# Patient Record
Sex: Female | Born: 1950 | Race: White | Hispanic: No | Marital: Married | Smoking: Never smoker
Health system: Southern US, Community
[De-identification: ages and names within clinical notes are randomized; demographics above are authoritative.]

## PROBLEM LIST (undated history)

## (undated) DIAGNOSIS — N200 Calculus of kidney: Secondary | ICD-10-CM

## (undated) DIAGNOSIS — I10 Essential (primary) hypertension: Secondary | ICD-10-CM

## (undated) DIAGNOSIS — Z87442 Personal history of urinary calculi: Secondary | ICD-10-CM

## (undated) DIAGNOSIS — N289 Disorder of kidney and ureter, unspecified: Secondary | ICD-10-CM

## (undated) DIAGNOSIS — M1990 Unspecified osteoarthritis, unspecified site: Secondary | ICD-10-CM

## (undated) HISTORY — PX: BELPHAROPTOSIS REPAIR: SHX369

## (undated) HISTORY — PX: EYE SURGERY: SHX253

## (undated) HISTORY — PX: APPENDECTOMY: SHX54

## (undated) HISTORY — PX: KIDNEY STONE SURGERY: SHX686

---

## 1979-09-17 HISTORY — PX: CHOLECYSTECTOMY: SHX55

## 1998-09-16 HISTORY — PX: CARPAL TUNNEL RELEASE: SHX101

## 2002-09-16 HISTORY — PX: NISSEN FUNDOPLICATION: SHX2091

## 2003-08-02 ENCOUNTER — Ambulatory Visit (HOSPITAL_COMMUNITY): Admission: RE | Admit: 2003-08-02 | Discharge: 2003-08-02 | Payer: Self-pay | Admitting: Plastic Surgery

## 2003-08-02 ENCOUNTER — Ambulatory Visit (HOSPITAL_BASED_OUTPATIENT_CLINIC_OR_DEPARTMENT_OTHER): Admission: RE | Admit: 2003-08-02 | Discharge: 2003-08-02 | Payer: Self-pay | Admitting: Plastic Surgery

## 2011-04-02 ENCOUNTER — Ambulatory Visit
Admission: RE | Admit: 2011-04-02 | Discharge: 2011-04-02 | Disposition: A | Discharge status: Home / Self Care | Payer: Managed Care, Other (non HMO) | Source: Ambulatory Visit | Attending: *Deleted | Admitting: *Deleted

## 2011-04-02 ENCOUNTER — Other Ambulatory Visit: Payer: Self-pay | Admitting: *Deleted

## 2011-04-02 DIAGNOSIS — M549 Dorsalgia, unspecified: Secondary | ICD-10-CM

## 2012-09-16 HISTORY — PX: COLONOSCOPY: SHX174

## 2015-06-28 ENCOUNTER — Encounter (HOSPITAL_BASED_OUTPATIENT_CLINIC_OR_DEPARTMENT_OTHER): Payer: Self-pay | Admitting: *Deleted

## 2015-06-28 ENCOUNTER — Emergency Department (HOSPITAL_BASED_OUTPATIENT_CLINIC_OR_DEPARTMENT_OTHER): Payer: 59

## 2015-06-28 ENCOUNTER — Emergency Department (HOSPITAL_BASED_OUTPATIENT_CLINIC_OR_DEPARTMENT_OTHER)
Admission: EM | Admit: 2015-06-28 | Discharge: 2015-06-28 | Disposition: A | Discharge status: Home / Self Care | Payer: 59 | Attending: Emergency Medicine | Admitting: Emergency Medicine

## 2015-06-28 DIAGNOSIS — S300XXA Contusion of lower back and pelvis, initial encounter: Secondary | ICD-10-CM | POA: Diagnosis not present

## 2015-06-28 DIAGNOSIS — S39012A Strain of muscle, fascia and tendon of lower back, initial encounter: Secondary | ICD-10-CM | POA: Insufficient documentation

## 2015-06-28 DIAGNOSIS — Y9289 Other specified places as the place of occurrence of the external cause: Secondary | ICD-10-CM | POA: Diagnosis not present

## 2015-06-28 DIAGNOSIS — Z791 Long term (current) use of non-steroidal anti-inflammatories (NSAID): Secondary | ICD-10-CM | POA: Insufficient documentation

## 2015-06-28 DIAGNOSIS — W109XXA Fall (on) (from) unspecified stairs and steps, initial encounter: Secondary | ICD-10-CM | POA: Insufficient documentation

## 2015-06-28 DIAGNOSIS — S7012XA Contusion of left thigh, initial encounter: Secondary | ICD-10-CM | POA: Insufficient documentation

## 2015-06-28 DIAGNOSIS — Z79899 Other long term (current) drug therapy: Secondary | ICD-10-CM | POA: Insufficient documentation

## 2015-06-28 DIAGNOSIS — Z87442 Personal history of urinary calculi: Secondary | ICD-10-CM | POA: Insufficient documentation

## 2015-06-28 DIAGNOSIS — Y998 Other external cause status: Secondary | ICD-10-CM | POA: Diagnosis not present

## 2015-06-28 DIAGNOSIS — Y9389 Activity, other specified: Secondary | ICD-10-CM | POA: Insufficient documentation

## 2015-06-28 DIAGNOSIS — S79912A Unspecified injury of left hip, initial encounter: Secondary | ICD-10-CM | POA: Diagnosis not present

## 2015-06-28 DIAGNOSIS — S3992XA Unspecified injury of lower back, initial encounter: Secondary | ICD-10-CM | POA: Diagnosis present

## 2015-06-28 DIAGNOSIS — M25552 Pain in left hip: Secondary | ICD-10-CM

## 2015-06-28 DIAGNOSIS — R109 Unspecified abdominal pain: Secondary | ICD-10-CM

## 2015-06-28 HISTORY — DX: Calculus of kidney: N20.0

## 2015-06-28 HISTORY — DX: Disorder of kidney and ureter, unspecified: N28.9

## 2015-06-28 LAB — CBC WITH DIFFERENTIAL/PLATELET
BASOS ABS: 0 10*3/uL (ref 0.0–0.1)
Basophils Relative: 0 %
EOS PCT: 0 %
Eosinophils Absolute: 0 10*3/uL (ref 0.0–0.7)
HCT: 38.2 % (ref 36.0–46.0)
HEMOGLOBIN: 12.6 g/dL (ref 12.0–15.0)
LYMPHS ABS: 1.4 10*3/uL (ref 0.7–4.0)
LYMPHS PCT: 20 %
MCH: 30.8 pg (ref 26.0–34.0)
MCHC: 33 g/dL (ref 30.0–36.0)
MCV: 93.4 fL (ref 78.0–100.0)
MONO ABS: 0.5 10*3/uL (ref 0.1–1.0)
Monocytes Relative: 7 %
NEUTROS ABS: 5.2 10*3/uL (ref 1.7–7.7)
Neutrophils Relative %: 73 %
PLATELETS: 250 10*3/uL (ref 150–400)
RBC: 4.09 MIL/uL (ref 3.87–5.11)
RDW: 12.5 % (ref 11.5–15.5)
WBC: 7.2 10*3/uL (ref 4.0–10.5)

## 2015-06-28 LAB — URINALYSIS, ROUTINE W REFLEX MICROSCOPIC
Bilirubin Urine: NEGATIVE
GLUCOSE, UA: NEGATIVE mg/dL
HGB URINE DIPSTICK: NEGATIVE
Ketones, ur: 15 mg/dL — AB
Leukocytes, UA: NEGATIVE
Nitrite: NEGATIVE
Protein, ur: NEGATIVE mg/dL
SPECIFIC GRAVITY, URINE: 1.02 (ref 1.005–1.030)
Urobilinogen, UA: 0.2 mg/dL (ref 0.0–1.0)
pH: 7.5 (ref 5.0–8.0)

## 2015-06-28 LAB — BASIC METABOLIC PANEL
ANION GAP: 10 (ref 5–15)
BUN: 16 mg/dL (ref 6–20)
CHLORIDE: 103 mmol/L (ref 101–111)
CO2: 26 mmol/L (ref 22–32)
Calcium: 9.1 mg/dL (ref 8.9–10.3)
Creatinine, Ser: 0.52 mg/dL (ref 0.44–1.00)
Glucose, Bld: 141 mg/dL — ABNORMAL HIGH (ref 65–99)
POTASSIUM: 4.1 mmol/L (ref 3.5–5.1)
SODIUM: 139 mmol/L (ref 135–145)

## 2015-06-28 MED ORDER — SODIUM CHLORIDE 0.9 % IV BOLUS (SEPSIS)
500.0000 mL | Freq: Once | INTRAVENOUS | Status: AC
  Administered 2015-06-28: 500 mL via INTRAVENOUS

## 2015-06-28 MED ORDER — ONDANSETRON HCL 4 MG/2ML IJ SOLN
4.0000 mg | Freq: Once | INTRAMUSCULAR | Status: AC
  Administered 2015-06-28: 4 mg via INTRAVENOUS
  Filled 2015-06-28: qty 2

## 2015-06-28 MED ORDER — FENTANYL CITRATE (PF) 100 MCG/2ML IJ SOLN
50.0000 ug | Freq: Once | INTRAMUSCULAR | Status: AC
  Administered 2015-06-28: 50 ug via INTRAVENOUS
  Filled 2015-06-28: qty 2

## 2015-06-28 MED ORDER — SODIUM CHLORIDE 0.9 % IV SOLN
INTRAVENOUS | Status: DC
  Administered 2015-06-28: 10:00:00 via INTRAVENOUS

## 2015-06-28 MED ORDER — HYDROMORPHONE HCL 1 MG/ML IJ SOLN
1.0000 mg | Freq: Once | INTRAMUSCULAR | Status: AC
Start: 2015-06-28 — End: 2015-06-28
  Administered 2015-06-28: 1 mg via INTRAVENOUS
  Filled 2015-06-28: qty 1

## 2015-06-28 MED ORDER — CYCLOBENZAPRINE HCL 10 MG PO TABS: 10.0000 mg | ORAL_TABLET | Freq: Two times a day (BID) | ORAL | Status: DC | PRN

## 2015-06-28 MED ORDER — OXYCODONE-ACETAMINOPHEN 5-325 MG PO TABS: 1.0000 | ORAL_TABLET | Freq: Four times a day (QID) | ORAL | Status: DC | PRN

## 2015-06-28 NOTE — ED Notes (Signed)
Soda given to pt per her request.

## 2015-06-28 NOTE — ED Notes (Signed)
PT standing on arrival to room. After d/c papers discussed pt states she may black out. Pt assisted to lay flat on bed and given cool cloth for head. Dr Deretha EmoryZackowski made aware. States feels like it is the pain meds. On arrival back to room pt assisted to sitting position and states feels okay now. Husband assisted with clothes and pt assisted to wheelchair without difficulty. Pt states she is feeling better. Pt assisted to pharmacy and car by EMT Dawn.

## 2015-06-28 NOTE — Discharge Instructions (Signed)
Recommend rest off her feet is much as possible. Take the Flexeril on a regular basis take the the Percocet as needed. Make an appointment to follow-up with your regular doctor for next week. Return for any new or worse symptoms. Workup without any evidence of kidney stone. Also no evidence of any acute fracture to the low part of the back or pelvis or left hip.

## 2015-06-28 NOTE — ED Notes (Addendum)
C/o falling down 10 steps made of hardwood. States her leg stradled post at bottom of stairs.Denies LOC. Onset Monday. Bruising on upper left inner thigh and right butt cheek.C/o left back pain and left groin pain. Left leg feels weak. Pt states she has known kidney stones and pain is similar to her kidney stones.

## 2015-06-28 NOTE — ED Provider Notes (Signed)
CSN: [id number]     Arrival date & time 06/28/15  [id number] History   First MD Initiated Contact with Patient 06/28/15 [phone]     No chief complaint on file.    (Consider location/radiation/quality/duration/timing/severity/associated sxs/prior Treatment) The history is provided by the patient and the spouse.   64 year old female status post fall down 10 steps on Monday. Patient with complaint of pain to right buttocks with bruising and right hip pain following the fall. The fat has resolved. Patient also with some bruising to her left inner leg around thigh area. Starting yesterday patient developed left-sided back pain that radiated into the left groin and left hip. Leg feels weak but mostly just hurts to move the left leg. Toes move fine sensations intact no incontinence. Patient had no loss of consciousness with the fall down the stairs. No complaint of neck pain and head pain chest pain shortness of breath upper back pain or abdominal pain. Does have the flank pain however on the left side to just started yesterday. Denies any dysuria associated with some nausea but no vomiting. Patient has hydrocodone at home and took that without any relief. Patient has a past history of kidney stones states that this may be related. The pain is 10 out of 10. Sharp and achy in nature made worse by moving left leg.  Past Medical History  Diagnosis Date  . Renal disorder   . Kidney stones    History reviewed. No pertinent past surgical history. No family history on file. Social History  Substance Use Topics  . Smoking status: Never Smoker   . Smokeless tobacco: None  . Alcohol Use: None   OB History    No data available     Review of Systems  Constitutional: Negative for fever and fatigue.  HENT: Negative for congestion.   Eyes: Negative for visual disturbance.  Respiratory: Negative for shortness of breath.   Cardiovascular: Negative for chest pain.  Gastrointestinal: Positive for nausea. Negative  for vomiting and abdominal pain.  Genitourinary: Negative for dysuria.  Musculoskeletal: Positive for back pain. Negative for neck pain.  Skin: Negative for rash.  Neurological: Positive for weakness. Negative for numbness and headaches.  Hematological: Does not bruise/bleed easily.  Psychiatric/Behavioral: Negative for confusion.      Allergies  Flagyl  Home Medications   Prior to Admission medications   Medication Sig Start Date End Date Taking? Authorizing Provider  cetirizine (ZYRTEC) 10 MG tablet Take 10 mg by mouth daily.   Yes Historical Provider, MD  LORazepam (ATIVAN) 0.5 MG tablet Take 0.5 mg by mouth at bedtime.   Yes Historical Provider, MD  meloxicam (MOBIC) 7.5 MG tablet Take 7.5 mg by mouth daily.   Yes Historical Provider, MD  sertraline (ZOLOFT) 25 MG tablet Take 25 mg by mouth daily.   Yes Historical Provider, MD  cyclobenzaprine (FLEXERIL) 10 MG tablet Take 1 tablet (10 mg total) by mouth 2 (two) times daily as needed for muscle spasms. 06/28/15   Vanetta MuldersScott Brooklen Runquist, MD  oxyCODONE-acetaminophen (PERCOCET/ROXICET) 5-325 MG tablet Take 1-2 tablets by mouth every 6 (six) hours as needed for severe pain. 06/28/15   Vanetta MuldersScott Domonique Cothran, MD   BP 132/57 mmHg  Pulse 95  Temp(Src) 97.3 F (36.3 C) (Oral)  Resp 16  Ht 4\' 11"  (1.499 m)  Wt 150 lb (68.04 kg)  BMI 30.28 kg/m2  SpO2 98% Physical Exam  Constitutional: She is oriented to person, place, and time. She appears well-developed and well-nourished. She appears distressed.  HENT:  Head: Normocephalic and atraumatic.  Mouth/Throat: Oropharynx is clear and moist.  Eyes: Conjunctivae and EOM are normal. Pupils are equal, round, and reactive to light.  Neck: Normal range of motion. Neck supple.  Cardiovascular: Normal rate, regular rhythm and normal heart sounds.   No murmur heard. Pulmonary/Chest: Effort normal and breath sounds normal. No respiratory distress.  Abdominal: Soft. Bowel sounds are normal. There is no  tenderness.  Musculoskeletal: Normal range of motion. She exhibits tenderness.  Patient with discomfort to the lumbar back predominantly on the left side and some left hip pain particularly with range of motion. Neurovascularly intact distally. Right buttocks area with area of contusion. Bruising noted to left inner leg as well.  Neurological: She is alert and oriented to person, place, and time. No cranial nerve deficit. She exhibits normal muscle tone. Coordination normal.  Skin: Skin is warm.  Nursing note and vitals reviewed.   ED Course  Procedures (including critical care time) Labs Review Labs Reviewed  URINALYSIS, ROUTINE W REFLEX MICROSCOPIC (NOT AT [facility]) - Abnormal; Notable for the following:    APPearance CLOUDY (*)    Ketones, ur 15 (*)    All other components within normal limits  BASIC METABOLIC PANEL - Abnormal; Notable for the following:    Glucose, Bld 141 (*)    All other components within normal limits  CBC WITH DIFFERENTIAL/PLATELET   Results for orders placed or performed during the hospital encounter of 06/28/15  Urinalysis, Routine w reflex microscopic (not at [facility])  Result Value Ref Range   Color, Urine YELLOW YELLOW   APPearance CLOUDY (A) CLEAR   Specific Gravity, Urine 1.020 1.005 - 1.030   pH 7.5 5.0 - 8.0   Glucose, UA NEGATIVE NEGATIVE mg/dL   Hgb urine dipstick NEGATIVE NEGATIVE   Bilirubin Urine NEGATIVE NEGATIVE   Ketones, ur 15 (A) NEGATIVE mg/dL   Protein, ur NEGATIVE NEGATIVE mg/dL   Urobilinogen, UA 0.2 0.0 - 1.0 mg/dL   Nitrite NEGATIVE NEGATIVE   Leukocytes, UA NEGATIVE NEGATIVE  CBC with Differential/Platelet  Result Value Ref Range   WBC 7.2 4.0 - 10.5 K/uL   RBC 4.09 3.87 - 5.11 MIL/uL   Hemoglobin 12.6 12.0 - 15.0 g/dL   HCT 16.1 09.6 - 04.5 %   MCV 93.4 78.0 - 100.0 fL   MCH 30.8 26.0 - 34.0 pg   MCHC 33.0 30.0 - 36.0 g/dL   RDW 40.9 81.1 - 91.4 %   Platelets 250 150 - 400 K/uL   Neutrophils Relative % 73 %   Neutro Abs 5.2  1.7 - 7.7 K/uL   Lymphocytes Relative 20 %   Lymphs Abs 1.4 0.7 - 4.0 K/uL   Monocytes Relative 7 %   Monocytes Absolute 0.5 0.1 - 1.0 K/uL   Eosinophils Relative 0 %   Eosinophils Absolute 0.0 0.0 - 0.7 K/uL   Basophils Relative 0 %   Basophils Absolute 0.0 0.0 - 0.1 K/uL  Basic metabolic panel  Result Value Ref Range   Sodium 139 135 - 145 mmol/L   Potassium 4.1 3.5 - 5.1 mmol/L   Chloride 103 101 - 111 mmol/L   CO2 26 22 - 32 mmol/L   Glucose, Bld 141 (H) 65 - 99 mg/dL   BUN 16 6 - 20 mg/dL   Creatinine, Ser 7.82 0.44 - 1.00 mg/dL   Calcium 9.1 8.9 - 95.6 mg/dL   GFR calc non Af Amer >60 >60 mL/min   GFR calc Af Amer >60 >  60 mL/min   Anion gap 10 5 - 15     Imaging Review Ct Renal Stone Study  06/28/2015  CLINICAL DATA:  Left flank pain. Left groin pain. History of renal calculi. Bruising along the right buttock after a fall down some stairs 2 days ago. EXAM: CT ABDOMEN AND PELVIS WITHOUT CONTRAST TECHNIQUE: Multidetector CT imaging of the abdomen and pelvis was performed following the standard protocol without IV contrast. COMPARISON:  Report from 01/18/2013 FINDINGS: Lower chest:  Unremarkable Hepatobiliary: Geographic hepatic steatosis.  Cholecystectomy. Pancreas: Unremarkable Spleen: Unremarkable Adrenals/Urinary Tract: 2 mm right kidney lower pole nonobstructive calculus. 2 mm left kidney lower pole nonobstructive calculus. 1 mm left mid kidney nonobstructive calculus. Stomach/Bowel: Unremarkable Vascular/Lymphatic: Unremarkable Reproductive: Unremarkable Other: Chronic faint stranding at the root of the mesentery. Musculoskeletal: Bruising along the right buttock overlying the gluteus maximus without significant signs of underlying muscular injury. Assuming that the transitional lumbosacral vertebra is L5, there is considerable degenerative disc disease at the L4-5 level likely causing foraminal impingement and central narrowing of the thecal sac. IMPRESSION: 1. Increased  impingement at L4-5 due to degenerative disc disease. 2. Bruising along the right buttock in the subcutaneous tissues, without underlying muscular abnormality. 3. The bilateral nonobstructive nephrolithiasis. 4. Geographic hepatic steatosis. 5. 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   On: 06/28/2015 10:34   Dg Hip Unilat With Pelvis 2-3 Views Left  06/28/2015  CLINICAL DATA:  Fall down stairs on Monday.  Bilateral hip pain. EXAM: DG HIP (WITH OR WITHOUT PELVIS) 2-3V LEFT COMPARISON:  None. FINDINGS: No cortical discontinuity along the left hip to suggest hip fracture. Minimal degenerative subcortical cyst formation along the left acetabulum has a chronic appearance. Regional pelvis unremarkable. Transitional lumbosacral vertebra noted. IMPRESSION: 1. No acute bony findings. If pain persists despite conservative therapy, MRI may be warranted for further characterization. Electronically Signed   By: Gaylyn Rong M.D.   On: 06/28/2015 11:42   I have personally reviewed and evaluated these images and lab results as part of my medical decision-making.   EKG Interpretation None      MDM   Final diagnoses:  Flank pain  Lumbar strain, initial encounter  Hip pain, acute, left    Patient with a fall down 10 steps on Monday. No loss of consciousness. Patient with a bruise to her right buttocks area and some right hip pain following fall. But that resolved. Then yesterday patient started with left-sided pain left hip pain left low back pain. Does not radiate into the posterior part of her legs is no numbness or weakness to her feet. But it is difficult to walk. Patient felt that this may have been related to kidney stone she's had this before. However workup showed no evidence of any ureteral stone. Also the CT scan showed some degenerative changes in the low part of her back which  could be contributing to the pain. Pelvis area and hips looked all right but since patient had such increased pain in the left hip x-rays of that area were done to rule out any fracture. No evidence of fracture there. In addition patient did not have left hip pain following a fall that started afterwards. No significant right hip pain.  Patient required significant pain control. 100 mg of fentanyl and 1 mg of hydromorphone to get some relief. Believe that this is probably a lumbar strain with an exacerbation of  some chronic back pain. Patient's already on mobile. Will continue with Flexeril and Percocet. Patient does have hydrocodone at home but she stated that it did not help her at all. Patient without any significant neuro focal deficits. Patient has primary care doctor to follow-up with.    Vanetta Mulders, MD 06/28/15 1344

## 2015-06-28 NOTE — ED Notes (Signed)
Dr Deretha EmoryZackowski aware of pt pain. Order given.

## 2015-06-28 NOTE — ED Notes (Signed)
Husband offered something to drink.

## 2015-06-28 NOTE — ED Notes (Signed)
Husband offered something to drink. 

## 2015-06-28 NOTE — ED Notes (Signed)
Pt placed on cont pulse ox. O2 sats 99%.

## 2017-11-14 HISTORY — PX: BACK SURGERY: SHX140

## 2018-04-03 ENCOUNTER — Encounter: Payer: Self-pay | Admitting: Physical Therapy

## 2018-04-03 ENCOUNTER — Ambulatory Visit: Payer: Medicare Other | Attending: Neurosurgery | Admitting: Physical Therapy

## 2018-04-03 ENCOUNTER — Other Ambulatory Visit: Payer: Self-pay

## 2018-04-03 DIAGNOSIS — M5416 Radiculopathy, lumbar region: Secondary | ICD-10-CM | POA: Insufficient documentation

## 2018-04-03 DIAGNOSIS — M25652 Stiffness of left hip, not elsewhere classified: Secondary | ICD-10-CM

## 2018-04-03 DIAGNOSIS — R262 Difficulty in walking, not elsewhere classified: Secondary | ICD-10-CM | POA: Diagnosis present

## 2018-04-03 DIAGNOSIS — M6281 Muscle weakness (generalized): Secondary | ICD-10-CM | POA: Insufficient documentation

## 2018-04-03 NOTE — Patient Instructions (Signed)
     Abdominal brace in lying, standing, sitting   5 sec holds         Lavinia SharpsStacy Tzipporah Nagorski PT [facility] [address], [address] [place], [place] [address] Phone # [phone] Fax [phone]

## 2018-04-03 NOTE — Therapy (Signed)
[facility] [address], [address] [place], [place], [address] Phone: [phone]   Fax:  [phone]  Physical Therapy Evaluation  Patient Details  Name: Brenda Mccall MRN: [id number] Date of Birth: 1951-05-31 Referring Provider: Dr. Newell Coral    Encounter Date: 04/03/2018  PT End of Session - 04/03/18 1208    Visit Number  1    Date for PT Re-Evaluation  05/29/18    Authorization Type  Medicare    PT Start Time  1100    PT Stop Time  1145    PT Time Calculation (min)  45 min    Activity Tolerance  Patient tolerated treatment well       Past Medical History:  Diagnosis Date  . Kidney stones   . Renal disorder     History reviewed. No pertinent surgical history.  There were no vitals filed for this visit.   Subjective Assessment - 04/03/18 1058    Subjective  3 years ago fell down basement steps while carrying paint trays.  Went to ER had x-rays, then later had MRI herniated disc L2-3.  Had surgery in March discectomy/ lami L3-4.   Felt better for 1 week.  Then got a bad cold with a lot of coughing.  Less left thigh numbness and foot symptoms since surgery.  Driving or first few steps after sitting a long time.  Tender left low back and left buttock.      Pertinent History  had PT in [place] before surgery which didn't help;  traction worsened      Limitations  House hold activities;Sitting;Standing    How long can you sit comfortably?  after rising bothers, car is the worst > 45 min     How long can you walk comfortably?  not very far, worse with incline < 200 feet    Diagnostic tests  not since surgery     Patient Stated Goals  I want to cross my leg and tie my shoe;  lift leg in shower to shave legs;  sit on the floor to play with grandchildren;  lose weight;  get back to gardening    Currently in Pain?  Yes    Pain Score  0-No pain    Pain Location  Back    Pain Orientation  Left    Pain Type  Chronic pain    Pain Onset   More than a month ago    Pain Frequency  Intermittent    Aggravating Factors   lying down to sleep (diff sleeping); first few steps out of bed, chair or car;  walking     Pain Relieving Factors  ice, heat; be still;  Biofreeze         [facility] PT Assessment - 04/03/18 0001      Assessment   Medical Diagnosis  lumbar radiculopathy    Referring Provider  Dr. Newell Coral     Onset Date/Surgical Date  -- > 6 months    Next MD Visit  October    Prior Therapy  prior to surgery       Precautions   Precautions  None      Restrictions   Weight Bearing Restrictions  No      Balance Screen   Has the patient fallen in the past 6 months  No    Has the patient had a decrease in activity level because of a fear of falling?   No    Is the patient  reluctant to leave their home because of a fear of falling?   No      Home Environment   Living Environment  Private residence    Living Arrangements  Spouse/significant other    Type of Home  House    Home Access  Stairs to enter    Entrance Stairs-Number of Steps  2    Home Layout  Laundry or work area in basement sewing  machine downstairs uses rails one at time      Prior Function   Level of Independence  Independent with basic ADLs    Vocation  Retired    Leisure  play with grandkids, quilting      Observation/Other Assessments   Focus on Therapeutic Outcomes (FOTO)   48% limitation       Posture/Postural Control   Posture/Postural Control  Postural limitations    Postural Limitations  Decreased lumbar lordosis      AROM   Right Hip Extension  5    Right Hip Flexion  110    Right Hip External Rotation   40    Right Hip Internal Rotation   20    Left Hip Extension  0    Left Hip Flexion  85 painful     Left Hip External Rotation   25    Left Hip Internal Rotation   0    Lumbar Flexion  60    Lumbar Extension  10    Lumbar - Right Side Bend  30    Lumbar - Left Side Bend  30      Strength   Right Hip Flexion  4+/5    Right Hip  Extension  4/5    Right Hip External Rotation   4/5    Right Hip ABduction  4-/5    Left Hip Flexion  2+/5 painful    Left Hip Extension  4-/5    Left Hip External Rotation  3/5    Left Hip Internal Rotation  3/5    Left Hip ABduction  2+/5    Right/Left Knee  Right;Left sit to stand without UEs    Right Knee Flexion  4+/5    Right Knee Extension  4+/5    Left Knee Flexion  4/5    Left Knee Extension  4-/5    Right/Left Ankle  -- Able to heel and toe walk with ease    Lumbar Flexion  4-/5    Lumbar Extension  4-/5      Flexibility   Soft Tissue Assessment /Muscle Length  yes    Hamstrings  left    Quadriceps  distal quads norm    Quadratus Lumborum  decreased left      Palpation   Palpation comment  tender left QL, gluteals, piriformis, ITB      FABER test   findings  Positive    Side  LEft      Slump test   Findings  Negative      Prone Knee Bend Test   Findings  Negative      Straight Leg Raise   Findings  Positive    Comment  mild 55 degrees                Objective measurements completed on examination: See above findings.              PT Education - 04/03/18 1157    Education Details  abdominal brace in supine, sidelying, seated, standing;  discussed activity level in regards to pain/when to stop;  patient education on tender points and physiology    Person(s) Educated  Patient    Methods  Explanation;Demonstration;Handout    Comprehension  Returned demonstration;Verbalized understanding       PT Short Term Goals - 04/03/18 1227      PT SHORT TERM GOAL #1   Title  The patient will demonstrate knowledge of initial HEP and self care strategies appropriate for current problem     Time  4    Period  Weeks    Status  New    Target Date  05/01/18      PT SHORT TERM GOAL #2   Title  The patient will report a 30% reduction in back and hip pain with rising from the chair or bed    Time  4    Period  Weeks    Status  New      PT SHORT  TERM GOAL #3   Title  Patient will have improved left hip internal rotation to 10 degrees and external rotation to 30 degrees for greater ease tying her shoe    Time  4    Period  Weeks    Status  New      PT SHORT TERM GOAL #4   Title  Improved hip flexion to 95 degrees and hip extension to 5 degrees for greater ease getting in/out of the car    Time  4    Period  Weeks    Status  New        PT Long Term Goals - 04/03/18 1231      PT LONG TERM GOAL #1   Title  The patient will be independent with safe, self progression of HEP      Time  8    Period  Weeks    Status  New    Target Date  05/29/18      PT LONG TERM GOAL #2   Title  The patient will report a 60% improvement in back and left hip/buttock pain with rising from the chair, bed or car     Time  8    Period  Weeks    Status  New      PT LONG TERM GOAL #3   Title  The patient will be able to walk 500 feet with minimal pain     Time  8    Period  Weeks    Status  New      PT LONG TERM GOAL #4   Title  Improved hip internal rotation ROM to 15 and external rotation to 40 degrees needed for tying shoes and getting down on the floor to play with grandkids    Time  8    Period  Weeks    Status  New      PT LONG TERM GOAL #5   Title  Core, hip and knee strength grossly 4 to 4+/5 needed to ascend and descend steps reciprocally.      Time  8    Period  Weeks    Status  New      Additional Long Term Goals   Additional Long Term Goals  Yes      PT LONG TERM GOAL #6   Title  FOTO functional outcome score improved from 48% limitation to 43% indicating improved function with less pain     Time  8    Period  Weeks  Status  New             Plan - 04/03/18 1209    Clinical Impression Statement  The patient reports her left LBP and left LE radiculopathy  initially began 3 years ago after a fall down the steps.  She had lumbar laminectomy/discectomy in March 2019 which relieved her foot pain and anterior thigh  pain however she continues to have left LBP and buttock pain and hip stiffness.  She reports pain is aggravated upon rising from the bed, chair or car.  She is limited in walking distance to approx 200 feet.  She has difficulty lifting her leg to tie her shoe.  She lacks mobility to get down and up from the floor to play with her grandchildren.   Decreased lumbar lordosis.   Decreased lumbar extension ROM.  Significant stiffness in left hip especially internal rotation, external rotation and extension.  Painful with hip flexion.  Significant weakness in hip flexors, abductors and rotators.  Trunk extensor and flexor weakness.  Numerous tender points in left QL, gluteals, piriformis, and ITB.  She would benefit from PT to address these deficits.      History and Personal Factors relevant to plan of care:  good home support;  minimal co-morbidities    Clinical Presentation  Stable    Clinical Decision Making  Low    Rehab Potential  Good    PT Frequency  2x / week    PT Duration  8 weeks    PT Treatment/Interventions  ADLs/Self Care Home Management;Cryotherapy;Electrical Stimulation;Ultrasound;Moist Heat;Iontophoresis 4mg /ml Dexamethasone;Therapeutic activities;Therapeutic exercise;Neuromuscular re-education;Patient/family education;Manual techniques;Taping;Dry needling    PT Next Visit Plan  manual therapy including left hip mobs, myofascial;  DN left gluteals, piriformis, QL, multifidi;  initiate hip mobility exercise; try ES/heat    Consulted and Agree with Plan of Care  Patient       Patient will benefit from skilled therapeutic intervention in order to improve the following deficits and impairments:  Pain, Increased fascial restricitons, Postural dysfunction, Decreased activity tolerance, Decreased range of motion, Decreased strength, Hypomobility, Impaired perceived functional ability, Difficulty walking  Visit Diagnosis: Radiculopathy, lumbar region - Plan: PT plan of care  cert/re-cert  Stiffness of left hip, not elsewhere classified - Plan: PT plan of care cert/re-cert  Muscle weakness (generalized) - Plan: PT plan of care cert/re-cert  Difficulty in walking, not elsewhere classified - Plan: PT plan of care cert/re-cert     Problem List There are no active problems to display for this patient.  Lavinia SharpsStacy Jalin Alicea, PT 04/03/18 12:42 PM Phone: [phone] Fax: [phone]  Vivien PrestoSimpson, Jahel Wavra C 04/03/2018, 12:41 PM  [facility] [address], [address] [place], [place], [address] Phone: [phone]   Fax:  [phone]  Name: Brenda Mccall MRN: [id number] Date of Birth: 1951-07-21

## 2018-04-07 ENCOUNTER — Ambulatory Visit: Payer: Medicare Other | Admitting: Physical Therapy

## 2018-04-07 ENCOUNTER — Encounter: Payer: Self-pay | Admitting: Physical Therapy

## 2018-04-07 DIAGNOSIS — M25652 Stiffness of left hip, not elsewhere classified: Secondary | ICD-10-CM

## 2018-04-07 DIAGNOSIS — M6281 Muscle weakness (generalized): Secondary | ICD-10-CM

## 2018-04-07 DIAGNOSIS — M5416 Radiculopathy, lumbar region: Secondary | ICD-10-CM | POA: Diagnosis not present

## 2018-04-07 DIAGNOSIS — R262 Difficulty in walking, not elsewhere classified: Secondary | ICD-10-CM

## 2018-04-07 NOTE — Therapy (Signed)
[facility] [address] [place], [place], [address] Phone: [phone]   Fax:  [phone]  Physical Therapy Treatment  Patient Details  Name: Brenda Mccall MRN: [id number] Date of Birth: 05-28-1951 Referring Provider: Dr. Newell Coral    Encounter Date: 04/07/2018  PT End of Session - 04/07/18 1814    Visit Number  2    Date for PT Re-Evaluation  05/29/18    Authorization Type  Medicare    PT Start Time  1356    PT Stop Time  1445    PT Time Calculation (min)  49 min    Activity Tolerance  Patient tolerated treatment well       Past Medical History:  Diagnosis Date  . Kidney stones   . Renal disorder     History reviewed. No pertinent surgical history.  There were no vitals filed for this visit.  Subjective Assessment - 04/07/18 1354    Subjective  No changes.  Woke up several times last night.  Going for a massage next Wed.      Pertinent History  had PT in [place] before surgery which didn't help;  traction worsened      Currently in Pain?  Yes    Pain Score  2     Pain Location  Buttocks    Pain Orientation  Left    Pain Type  Chronic pain                       [facility] Adult PT Treatment/Exercise - 04/07/18 0001      Lumbar Exercises: Supine   Isometric Hip Flexion  10 reps    Large Ball Abdominal Isometric Limitations  modified piriformis heel slide on shin 10x    Other Supine Lumbar Exercises  green ball roll for hip/knee flexion 10x    Other Supine Lumbar Exercises  lumbar rotation with green ball 5x      Moist Heat Therapy   Number Minutes Moist Heat  5 Minutes    Moist Heat Location  Lumbar Spine;Hip      Manual Therapy   Joint Mobilization  left hip long axis distraction, inferior, AP in internal rotation grade 2/3 3x 20 sec    Soft tissue mobilization  gluteals, quads, lumbar paraspinals    Muscle Energy Technique  left piriformis contract relax 3x 5 sec hold       Trigger  Point Dry Needling - 04/07/18 1814    Consent Given?  Yes    Education Handout Provided  Yes    Muscles Treated Upper Body  Quadratus Lumborum    Muscles Treated Lower Body  Gluteus minimus;Gluteus maximus;Piriformis bil lumbar multifidi     Gluteus Maximus Response  Twitch response elicited;Palpable increased muscle length    Gluteus Minimus Response  Twitch response elicited;Palpable increased muscle length    Piriformis Response  Twitch response elicited;Palpable increased muscle length           PT Education - 04/07/18 1436    Education Details   Access Code: [email or web address] hand to knee push;  physioball lumbar rotation, hip/lumbar flexion with ball;  modified piriformis 5x    Person(s) Educated  Patient    Methods  Explanation;Demonstration;Handout    Comprehension  Returned demonstration;Verbalized understanding       PT Short Term Goals - 04/03/18 1227      PT SHORT TERM GOAL #1   Title  The patient will demonstrate knowledge of initial  HEP and self care strategies appropriate for current problem     Time  4    Period  Weeks    Status  New    Target Date  05/01/18      PT SHORT TERM GOAL #2   Title  The patient will report a 30% reduction in back and hip pain with rising from the chair or bed    Time  4    Period  Weeks    Status  New      PT SHORT TERM GOAL #3   Title  Patient will have improved left hip internal rotation to 10 degrees and external rotation to 30 degrees for greater ease tying her shoe    Time  4    Period  Weeks    Status  New      PT SHORT TERM GOAL #4   Title  Improved hip flexion to 95 degrees and hip extension to 5 degrees for greater ease getting in/out of the car    Time  4    Period  Weeks    Status  New        PT Long Term Goals - 04/03/18 1231      PT LONG TERM GOAL #1   Title  The patient will be independent with safe, self progression of HEP      Time  8    Period  Weeks    Status  New    Target Date  05/29/18      PT LONG  TERM GOAL #2   Title  The patient will report a 60% improvement in back and left hip/buttock pain with rising from the chair, bed or car     Time  8    Period  Weeks    Status  New      PT LONG TERM GOAL #3   Title  The patient will be able to walk 500 feet with minimal pain     Time  8    Period  Weeks    Status  New      PT LONG TERM GOAL #4   Title  Improved hip internal rotation ROM to 15 and external rotation to 40 degrees needed for tying shoes and getting down on the floor to play with grandkids    Time  8    Period  Weeks    Status  New      PT LONG TERM GOAL #5   Title  Core, hip and knee strength grossly 4 to 4+/5 needed to ascend and descend steps reciprocally.      Time  8    Period  Weeks    Status  New      Additional Long Term Goals   Additional Long Term Goals  Yes      PT LONG TERM GOAL #6   Title  FOTO functional outcome score improved from 48% limitation to 43% indicating improved function with less pain     Time  8    Period  Weeks    Status  New            Plan - 04/07/18 1815    Clinical Impression Statement  The patient continues to have stiffness and pain with hip ROM.  Improved joint and soft tissue mobility following DN and manual therapy.   Progressed HEP to include hip ROM using physioball since she has one at home.  Good carryover with abdominal  brace.  Therapist closely monitoring response to all interventions.      Rehab Potential  Good    PT Frequency  2x / week    PT Duration  8 weeks    PT Treatment/Interventions  ADLs/Self Care Home Management;Cryotherapy;Electrical Stimulation;Ultrasound;Moist Heat;Iontophoresis 4mg /ml Dexamethasone;Therapeutic activities;Therapeutic exercise;Neuromuscular re-education;Patient/family education;Manual techniques;Taping;Dry needling    PT Next Visit Plan  manual therapy including left hip mobs, myofascial;  assess response to DN #1  left gluteals, piriformis, QL, multifidi;  hip mobility exercise; core  strengthening       Patient will benefit from skilled therapeutic intervention in order to improve the following deficits and impairments:  Pain, Increased fascial restricitons, Postural dysfunction, Decreased activity tolerance, Decreased range of motion, Decreased strength, Hypomobility, Impaired perceived functional ability, Difficulty walking  Visit Diagnosis: Radiculopathy, lumbar region  Stiffness of left hip, not elsewhere classified  Muscle weakness (generalized)  Difficulty in walking, not elsewhere classified     Problem List There are no active problems to display for this patient.  Lavinia Sharps, PT 04/07/18 6:22 PM Phone: [phone] Fax: [phone]  Vivien Presto 04/07/2018, 6:22 PM  [facility] [address] [place], [place], [address] Phone: [phone]   Fax:  [phone]  Name: Brenda Mccall MRN: [id number] Date of Birth: 1951/04/25

## 2018-04-07 NOTE — Patient Instructions (Signed)
   Access Code: [email or web address]  URL: [email or web address]  Date: 04/07/2018  Prepared by: Lavinia SharpsStacy Laurey Salser   Exercises  Supine Hip and Knee Flexion AROM with Swiss Ball - 10 reps - 1 sets - 1x daily - 7x weekly  Supine Lower Trunk Rotation with Swiss Ball - 10 reps - 1 sets - 1x daily - 7x weekly  Supine Single Leg Hip and Knee Flexion ROM with Swiss Ball - 10 reps - 1 sets - 1x daily - 7x weekly  Supine Transversus Abdominis Bracing - Hands on Thighs - 10 reps - 1 sets - 1x daily - 7x weekly         Trigger Point Dry Needling  . What is Trigger Point Dry Needling (DN)? o DN is a physical therapy technique used to treat muscle pain and dysfunction. Specifically, DN helps deactivate muscle trigger points (muscle knots).  o A thin filiform needle is used to penetrate the skin and stimulate the underlying trigger point. The goal is for a local twitch response (LTR) to occur and for the trigger point to relax. No medication of any kind is injected during the procedure.   . What Does Trigger Point Dry Needling Feel Like?  o The procedure feels different for each individual patient. Some patients report that they do not actually feel the needle enter the skin and overall the process is not painful. Very mild bleeding may occur. However, many patients feel a deep cramping in the muscle in which the needle was inserted. This is the local twitch response.   Marland Kitchen. How Will I feel after the treatment? o Soreness is normal, and the onset of soreness may not occur for a few hours. Typically this soreness does not last longer than two days.  o Bruising is uncommon, however; ice can be used to decrease any possible bruising.  o In rare cases feeling tired or nauseous after the treatment is normal. In addition, your symptoms may get worse before they get better, this period will typically not last longer than 24 hours.   . What Can I do After My Treatment? o Increase your hydration by drinking  more water for the next 24 hours. o You may place ice or heat on the areas treated that have become sore, however, do not use heat on inflamed or bruised areas. Heat often brings more relief post needling. o You can continue your regular activities, but vigorous activity is not recommended initially after the treatment for 24 hours. o DN is best combined with other physical therapy such as strengthening, stretching, and other therapies.     Lavinia SharpsStacy Manasi Dishon PT [facility] [address], [address] [place], [place] [address] Phone # [phone] Fax [phone]

## 2018-04-09 ENCOUNTER — Ambulatory Visit: Payer: Medicare Other | Admitting: Physical Therapy

## 2018-04-09 ENCOUNTER — Encounter: Payer: Self-pay | Admitting: Physical Therapy

## 2018-04-09 DIAGNOSIS — M5416 Radiculopathy, lumbar region: Secondary | ICD-10-CM

## 2018-04-09 DIAGNOSIS — M6281 Muscle weakness (generalized): Secondary | ICD-10-CM

## 2018-04-09 DIAGNOSIS — R262 Difficulty in walking, not elsewhere classified: Secondary | ICD-10-CM

## 2018-04-09 DIAGNOSIS — M25652 Stiffness of left hip, not elsewhere classified: Secondary | ICD-10-CM

## 2018-04-09 NOTE — Therapy (Signed)
[facility] [address], [address] [place], [place], [address] Phone: [phone]   Fax:  [phone]  Physical Therapy Treatment  Patient Details  Name: Brenda Mccall MRN: [id number] Date of Birth: 1951/03/11 Referring Provider: Dr. Newell Coral    Encounter Date: 04/09/2018  PT End of Session - 04/09/18 1309    Visit Number  3    Date for PT Re-Evaluation  05/29/18    Authorization Type  Medicare    PT Start Time  1154    PT Stop Time  1245    PT Time Calculation (min)  51 min    Activity Tolerance  Patient tolerated treatment well       Past Medical History:  Diagnosis Date  . Kidney stones   . Renal disorder     History reviewed. No pertinent surgical history.  There were no vitals filed for this visit.  Subjective Assessment - 04/09/18 1209    Subjective  I'm limping today.  It's hurting.  Left medial thigh and lateral thigh pain/numbness.      Currently in Pain?  Yes    Pain Score  4     Pain Location  Hip    Pain Orientation  Left    Pain Type  Chronic pain                       [facility] Adult PT Treatment/Exercise - 04/09/18 0001      Therapeutic Activites    Therapeutic Activities  Other Therapeutic Activities    Other Therapeutic Activities  neuroscience of pain education       Lumbar Exercises: Seated   Sit to Stand Limitations  foot press into the floor to activate quads 10x     Other Seated Lumbar Exercises  rocker board 15x ankle DF/PF     Other Seated Lumbar Exercises  heel slides 15x       Lumbar Exercises: Supine   Ab Set  10 reps    Dead Bug  10 reps heel taps on bolster 10x    Other Supine Lumbar Exercises  ball squeeze 10x 5 sec hold    Other Supine Lumbar Exercises  feet resting on bolster  clams double and single 5x each       Lumbar Exercises: Sidelying   Other Sidelying Lumbar Exercises  active assisted hip flexion 15x      Moist Heat Therapy   Number Minutes Moist Heat   30 Minutes    Moist Heat Location  Lumbar Spine;Hip      Electrical Stimulation   Electrical Stimulation Location  back, hip, medial and lateral thigh    Electrical Stimulation Action  IFC    Electrical Stimulation Parameters  15 ma 30 min part of the time in conjunction with ex's     Electrical Stimulation Goals  Pain             PT Education - 04/09/18 1308    Education Details  home TENs info;  neuroscience of pain info    Person(s) Educated  Patient    Methods  Explanation;Handout    Comprehension  Verbalized understanding       PT Short Term Goals - 04/09/18 1336      PT SHORT TERM GOAL #1   Title  The patient will demonstrate knowledge of initial HEP and self care strategies appropriate for current problem     Time  4    Period  Weeks  Status  On-going      PT SHORT TERM GOAL #2   Title  The patient will report a 30% reduction in back and hip pain with rising from the chair or bed    Time  4    Period  Weeks    Status  On-going      PT SHORT TERM GOAL #3   Title  Patient will have improved left hip internal rotation to 10 degrees and external rotation to 30 degrees for greater ease tying her shoe    Time  4    Period  Weeks    Status  On-going      PT SHORT TERM GOAL #4   Title  Improved hip flexion to 95 degrees and hip extension to 5 degrees for greater ease getting in/out of the car    Time  4    Period  Weeks    Status  On-going        PT Long Term Goals - 04/03/18 1231      PT LONG TERM GOAL #1   Title  The patient will be independent with safe, self progression of HEP      Time  8    Period  Weeks    Status  New    Target Date  05/29/18      PT LONG TERM GOAL #2   Title  The patient will report a 60% improvement in back and left hip/buttock pain with rising from the chair, bed or car     Time  8    Period  Weeks    Status  New      PT LONG TERM GOAL #3   Title  The patient will be able to walk 500 feet with minimal pain     Time  8     Period  Weeks    Status  New      PT LONG TERM GOAL #4   Title  Improved hip internal rotation ROM to 15 and external rotation to 40 degrees needed for tying shoes and getting down on the floor to play with grandkids    Time  8    Period  Weeks    Status  New      PT LONG TERM GOAL #5   Title  Core, hip and knee strength grossly 4 to 4+/5 needed to ascend and descend steps reciprocally.      Time  8    Period  Weeks    Status  New      Additional Long Term Goals   Additional Long Term Goals  Yes      PT LONG TERM GOAL #6   Title  FOTO functional outcome score improved from 48% limitation to 43% indicating improved function with less pain     Time  8    Period  Weeks    Status  New            Plan - 04/09/18 1310    Clinical Impression Statement  The patient presents with increased antalgic gait today.  Although she rates her pain at a 4, she is moving as if the pain is severe.  She is quite painful with moving her left LE especially hip flexion.  Sidelying hip flexion better than supine hip flexion.  Used ES/heat prior to and during low level exercises for pain control.  She reports improved pain intensity following treatment session.   Therapist closely monitoring response  with all treatment interventions.      Rehab Potential  Good    PT Frequency  2x / week    PT Duration  8 weeks    PT Treatment/Interventions  ADLs/Self Care Home Management;Cryotherapy;Electrical Stimulation;Ultrasound;Moist Heat;Iontophoresis 4mg /ml Dexamethasone;Therapeutic activities;Therapeutic exercise;Neuromuscular re-education;Patient/family education;Manual techniques;Taping;Dry needling    PT Next Visit Plan  modalities as needed;  assess response to low level mobility ex's today;  manual therapy including left hip mobs, myofascial; DN if helpful left gluteals, piriformis, QL, multifidi;  hip mobility exercise; core strengthening       Patient will benefit from skilled therapeutic  intervention in order to improve the following deficits and impairments:  Pain, Increased fascial restricitons, Postural dysfunction, Decreased activity tolerance, Decreased range of motion, Decreased strength, Hypomobility, Impaired perceived functional ability, Difficulty walking  Visit Diagnosis: Radiculopathy, lumbar region  Stiffness of left hip, not elsewhere classified  Muscle weakness (generalized)  Difficulty in walking, not elsewhere classified     Problem List There are no active problems to display for this patient.  Lavinia SharpsStacy Simpson, PT 04/09/18 1:39 PM Phone: [phone] Fax: [phone]  Vivien PrestoSimpson, Stacy C 04/09/2018, 1:39 PM  [facility] [address] [place], [place], [address] Phone: [phone]   Fax:  [phone]  Name: Brenda Mccall MRN: [id number] Date of Birth: [birth year]

## 2018-04-09 NOTE — Patient Instructions (Signed)
      Curable App   TENS UNIT  This is helpful for muscle pain and spasm.   Search and Purchase a TENS 7000 2nd edition at www.tenspros.com or www.amazon.com  (It should be less than $30)     TENS unit instructions:   Do not shower or bathe with the unit on  Turn the unit off before removing electrodes or batteries  If the electrodes lose stickiness add a drop of water to the electrodes after they are disconnected from the unit and place on plastic sheet. If you continued to have difficulty, call the TENS unit company to purchase more electrodes.  Do not apply lotion on the skin area prior to use. Make sure the skin is clean and dry as this will help prolong the life of the electrodes.  After use, always check skin for unusual red areas, rash or other skin difficulties. If there are any skin problems, does not apply electrodes to the same area.  Never remove the electrodes from the unit by pulling the wires.  Do not use the TENS unit or electrodes other than as directed.  Do not change electrode placement without consulting your therapist or physician.  Keep 2 fingers with between each electrode.     Lavinia SharpsStacy Simpson PT [facility] [address], [address] [place], [place] [address] Phone # [phone] Fax [phone]

## 2018-04-14 ENCOUNTER — Encounter: Payer: Self-pay | Admitting: Physical Therapy

## 2018-04-14 ENCOUNTER — Ambulatory Visit: Payer: Medicare Other | Admitting: Physical Therapy

## 2018-04-14 DIAGNOSIS — M5416 Radiculopathy, lumbar region: Secondary | ICD-10-CM

## 2018-04-14 DIAGNOSIS — M6281 Muscle weakness (generalized): Secondary | ICD-10-CM

## 2018-04-14 DIAGNOSIS — R262 Difficulty in walking, not elsewhere classified: Secondary | ICD-10-CM

## 2018-04-14 DIAGNOSIS — M25652 Stiffness of left hip, not elsewhere classified: Secondary | ICD-10-CM

## 2018-04-14 NOTE — Therapy (Signed)
[facility] [address] [place], [place], [address] Phone: [phone]   Fax:  [phone]  Physical Therapy Treatment  Patient Details  Name: Brenda Mccall MRN: [id number] Date of Birth: 09-08-1951 Referring Provider: Dr. Newell Coral    Encounter Date: 04/14/2018  PT End of Session - 04/14/18 1141    Visit Number  4    Date for PT Re-Evaluation  05/29/18    Authorization Type  Medicare    PT Start Time  1100    PT Stop Time  1145    PT Time Calculation (min)  45 min    Activity Tolerance  Patient tolerated treatment well       Past Medical History:  Diagnosis Date  . Kidney stones   . Renal disorder     History reviewed. No pertinent surgical history.  There were no vitals filed for this visit.  Subjective Assessment - 04/14/18 1059    Subjective  Feeling fair.  Yesterday was a bad day.  Got her home TENs unit.  Yesterday radiated down the leg.  Today medial piriformis border is the most painful but it moves a lot.  Going for a massage tomorrow.      Pertinent History  had PT in [place] before surgery which didn't help;  traction worsened; lumbar discectomy/laminectomy March 2019    How long can you sit comfortably?  after rising bothers, car is the worst > 45 min     Patient Stated Goals  I want to cross my leg and tie my shoe;  lift leg in shower to shave legs;  sit on the floor to play with grandchildren;  lose weight;  get back to gardening    Currently in Pain?  Yes    Pain Score  2  back  feels tired    Pain Location  Hip    Pain Orientation  Left    Pain Type  Chronic pain                       [facility] Adult PT Treatment/Exercise - 04/14/18 0001      Moist Heat Therapy   Number Minutes Moist Heat  5 Minutes    Moist Heat Location  Lumbar Spine;Hip      Electrical Stimulation   Electrical Stimulation Location  bil lumbar multifidi with DN     Electrical Stimulation Action  pre-mod     Electrical Stimulation Parameters  1.5 V 10    Electrical Stimulation Goals  Pain      Manual Therapy   Joint Mobilization  flexion rotation mob grade 3 3x 20 sec     Soft tissue mobilization  lumbar parspinals, gluteal, piriformis with Biofreeze       Trigger Point Dry Needling - 04/14/18 1140    Consent Given?  Yes    Muscles Treated Lower Body  -- bil lumbar multifiidi    Gluteus Maximus Response  Twitch response elicited;Palpable increased muscle length    Gluteus Minimus Response  Twitch response elicited;Palpable increased muscle length    Piriformis Response  Twitch response elicited;Palpable increased muscle length             PT Short Term Goals - 04/09/18 1336      PT SHORT TERM GOAL #1   Title  The patient will demonstrate knowledge of initial HEP and self care strategies appropriate for current problem     Time  4  Period  Weeks    Status  On-going      PT SHORT TERM GOAL #2   Title  The patient will report a 30% reduction in back and hip pain with rising from the chair or bed    Time  4    Period  Weeks    Status  On-going      PT SHORT TERM GOAL #3   Title  Patient will have improved left hip internal rotation to 10 degrees and external rotation to 30 degrees for greater ease tying her shoe    Time  4    Period  Weeks    Status  On-going      PT SHORT TERM GOAL #4   Title  Improved hip flexion to 95 degrees and hip extension to 5 degrees for greater ease getting in/out of the car    Time  4    Period  Weeks    Status  On-going        PT Long Term Goals - 04/03/18 1231      PT LONG TERM GOAL #1   Title  The patient will be independent with safe, self progression of HEP      Time  8    Period  Weeks    Status  New    Target Date  05/29/18      PT LONG TERM GOAL #2   Title  The patient will report a 60% improvement in back and left hip/buttock pain with rising from the chair, bed or car     Time  8    Period  Weeks    Status  New       PT LONG TERM GOAL #3   Title  The patient will be able to walk 500 feet with minimal pain     Time  8    Period  Weeks    Status  New      PT LONG TERM GOAL #4   Title  Improved hip internal rotation ROM to 15 and external rotation to 40 degrees needed for tying shoes and getting down on the floor to play with grandkids    Time  8    Period  Weeks    Status  New      PT LONG TERM GOAL #5   Title  Core, hip and knee strength grossly 4 to 4+/5 needed to ascend and descend steps reciprocally.      Time  8    Period  Weeks    Status  New      Additional Long Term Goals   Additional Long Term Goals  Yes      PT LONG TERM GOAL #6   Title  FOTO functional outcome score improved from 48% limitation to 43% indicating improved function with less pain     Time  8    Period  Weeks    Status  New            Plan - 04/14/18 1906    Clinical Impression Statement  The patient continues to have an antalgic gait today.  Although her pain is "not too bad" today she reports the pain has been bad with radiating pain down her leg.  Performed 2nd DN (no overall change with first DN) with combined ES to multifidi.  Good response to soft tissue mobilization with Biofreeze.  Therapist closely monitoring response with all treatment interventions.  Anticipate slower recovery secondary to  pain severity, chronicity and previous back surgery.      Rehab Potential  Good    PT Frequency  2x / week    PT Duration  8 weeks    PT Treatment/Interventions  ADLs/Self Care Home Management;Cryotherapy;Electrical Stimulation;Ultrasound;Moist Heat;Iontophoresis 4mg /ml Dexamethasone;Therapeutic activities;Therapeutic exercise;Neuromuscular re-education;Patient/family education;Manual techniques;Taping;Dry needling    PT Next Visit Plan  assess response to DN #2 with ES;  try KT star pattern;  very low level core strengthening and hip mobility ex;  try Nu-step or UBE    Recommended Other Services  now has home TENs        Patient will benefit from skilled therapeutic intervention in order to improve the following deficits and impairments:  Pain, Increased fascial restricitons, Postural dysfunction, Decreased activity tolerance, Decreased range of motion, Decreased strength, Hypomobility, Impaired perceived functional ability, Difficulty walking  Visit Diagnosis: Radiculopathy, lumbar region  Stiffness of left hip, not elsewhere classified  Muscle weakness (generalized)  Difficulty in walking, not elsewhere classified     Problem List There are no active problems to display for this patient.  Lavinia SharpsStacy Chelesea Weiand, PT 04/14/18 7:39 PM Phone: [phone] Fax: [phone]  Vivien PrestoSimpson, Jamillia Closson C 04/14/2018, 7:39 PM  [facility] [address] [place], [place], [address] Phone: [phone]   Fax:  [phone]  Name: Brenda Mccall MRN: [id number] Date of Birth: 1951-03-17

## 2018-04-16 ENCOUNTER — Ambulatory Visit: Payer: Medicare Other | Attending: Neurosurgery

## 2018-04-16 DIAGNOSIS — R262 Difficulty in walking, not elsewhere classified: Secondary | ICD-10-CM | POA: Insufficient documentation

## 2018-04-16 DIAGNOSIS — M5416 Radiculopathy, lumbar region: Secondary | ICD-10-CM | POA: Diagnosis not present

## 2018-04-16 DIAGNOSIS — M6281 Muscle weakness (generalized): Secondary | ICD-10-CM | POA: Diagnosis present

## 2018-04-16 DIAGNOSIS — M25652 Stiffness of left hip, not elsewhere classified: Secondary | ICD-10-CM | POA: Diagnosis present

## 2018-04-16 NOTE — Patient Instructions (Signed)
Access Code: [email or web address]  URL: [email or web address]  Date: 04/16/2018  Prepared by: Lorrene ReidKelly Shannen Vernon   Exercises    Hooklying Transversus Abdominis Palpation - 10 reps - 3 sets - 5 hold - 4x daily - 7x weekly  Seated Transversus Abdominis Bracing - 10 reps - 5 hold - 4x daily - 7x weekly  Modified Thomas Stretch - 3 reps - 1 sets - 20 hold - 2x daily - 7x weekly

## 2018-04-16 NOTE — Therapy (Signed)
[facility] [address] [place], [place], [address] Phone: [phone]   Fax:  [phone]  Physical Therapy Treatment  Patient Details  Name: Brenda Mccall MRN: [id number] Date of Birth: Jan 20, 1951 Referring Provider: Dr. Newell Coral    Encounter Date: 04/16/2018  PT End of Session - 04/16/18 1112    Visit Number  5    Date for PT Re-Evaluation  05/29/18    Authorization Type  Medicare    PT Start Time  1016    PT Stop Time  1101    PT Time Calculation (min)  45 min    Activity Tolerance  Patient tolerated treatment well    Behavior During Therapy  [facility] for tasks assessed/performed       Past Medical History:  Diagnosis Date  . Kidney stones   . Renal disorder     History reviewed. No pertinent surgical history.  There were no vitals filed for this visit.  Subjective Assessment - 04/16/18 1015    Subjective  I had a massage yesterday and I am pretty sore today.  I went home and soaked in Epsom salt and used heating pad.      Pertinent History  had PT in [place] before surgery which didn't help;  traction worsened; lumbar discectomy/laminectomy March 2019    Currently in Pain?  Yes    Pain Score  2     Pain Orientation  Left    Pain Descriptors / Indicators  Aching;Sore;Tingling    Pain Onset  More than a month ago    Pain Frequency  Intermittent                       [facility] Adult PT Treatment/Exercise - 04/16/18 0001      Lumbar Exercises: Stretches   Hip Flexor Stretch  Left;2 reps;20 seconds      Lumbar Exercises: Standing   Other Standing Lumbar Exercises  4" step up on Lt x10      Lumbar Exercises: Supine   Ab Set  10 reps    Other Supine Lumbar Exercises  ab bracing with hip abudction and marching     Other Supine Lumbar Exercises  ball rolls with red ball: hip flexion and knee flexion single and double leg      Manual Therapy   Manual Therapy  Taping    Kinesiotex  Inhibit Muscle       Kinesiotix   Inhibit Muscle   star pattern on back              PT Education - 04/16/18 1057    Education Details   Access Code: [email or web address] , neuroscience of pain    Person(s) Educated  Patient    Methods  Explanation;Demonstration;Handout    Comprehension  Verbalized understanding;Returned demonstration       PT Short Term Goals - 04/09/18 1336      PT SHORT TERM GOAL #1   Title  The patient will demonstrate knowledge of initial HEP and self care strategies appropriate for current problem     Time  4    Period  Weeks    Status  On-going      PT SHORT TERM GOAL #2   Title  The patient will report a 30% reduction in back and hip pain with rising from the chair or bed    Time  4    Period  Weeks    Status  On-going  PT SHORT TERM GOAL #3   Title  Patient will have improved left hip internal rotation to 10 degrees and external rotation to 30 degrees for greater ease tying her shoe    Time  4    Period  Weeks    Status  On-going      PT SHORT TERM GOAL #4   Title  Improved hip flexion to 95 degrees and hip extension to 5 degrees for greater ease getting in/out of the car    Time  4    Period  Weeks    Status  On-going        PT Long Term Goals - 04/03/18 1231      PT LONG TERM GOAL #1   Title  The patient will be independent with safe, self progression of HEP      Time  8    Period  Weeks    Status  New    Target Date  05/29/18      PT LONG TERM GOAL #2   Title  The patient will report a 60% improvement in back and left hip/buttock pain with rising from the chair, bed or car     Time  8    Period  Weeks    Status  New      PT LONG TERM GOAL #3   Title  The patient will be able to walk 500 feet with minimal pain     Time  8    Period  Weeks    Status  New      PT LONG TERM GOAL #4   Title  Improved hip internal rotation ROM to 15 and external rotation to 40 degrees needed for tying shoes and getting down on the floor to play with grandkids     Time  8    Period  Weeks    Status  New      PT LONG TERM GOAL #5   Title  Core, hip and knee strength grossly 4 to 4+/5 needed to ascend and descend steps reciprocally.      Time  8    Period  Weeks    Status  New      Additional Long Term Goals   Additional Long Term Goals  Yes      PT LONG TERM GOAL #6   Title  FOTO functional outcome score improved from 48% limitation to 43% indicating improved function with less pain     Time  8    Period  Weeks    Status  New            Plan - 04/16/18 1034    Clinical Impression Statement  Pt reports no change in pain overall since the start of care.  Pt demonstrates weakness in core and hips and is able to tolerate low level strength today.  PT discussed the neuroscience of pain and importance of calming the nervous system with chronic pain.  TA activation today with tactile and verbal cues today.  Lt LE demonstrates tremor with movement.  Pt will continue to benefit from skilled PT for core strength, gentle flexibility and manual as needed.      Rehab Potential  Good    PT Frequency  2x / week    PT Duration  8 weeks    PT Treatment/Interventions  ADLs/Self Care Home Management;Cryotherapy;Electrical Stimulation;Ultrasound;Moist Heat;Iontophoresis 4mg /ml Dexamethasone;Therapeutic activities;Therapeutic exercise;Neuromuscular re-education;Patient/family education;Manual techniques;Taping;Dry needling    PT Next Visit Plan  KT star pattern if helpful;  very low level core strengthening and hip mobility ex;  try Nu-step or UBE.  work on MotorolaLt LE strength    PT Home Exercise Plan   Access Code: [email or web address]     Consulted and Agree with Plan of Care  Patient       Patient will benefit from skilled therapeutic intervention in order to improve the following deficits and impairments:  Pain, Increased fascial restricitons, Postural dysfunction, Decreased activity tolerance, Decreased range of motion, Decreased strength, Hypomobility, Impaired  perceived functional ability, Difficulty walking  Visit Diagnosis: Radiculopathy, lumbar region  Stiffness of left hip, not elsewhere classified  Muscle weakness (generalized)  Difficulty in walking, not elsewhere classified     Problem List There are no active problems to display for this patient.   Lorrene ReidKelly Takacs, PT 04/16/18 11:14 AM  [facility] [address], [address] [place], [place], [address] Phone: [phone]   Fax:  [phone]  Name: Brenda Mccall MRN: [id number] Date of Birth: July 24, 1951

## 2018-04-21 ENCOUNTER — Ambulatory Visit: Payer: Medicare Other

## 2018-04-21 DIAGNOSIS — R262 Difficulty in walking, not elsewhere classified: Secondary | ICD-10-CM

## 2018-04-21 DIAGNOSIS — M6281 Muscle weakness (generalized): Secondary | ICD-10-CM

## 2018-04-21 DIAGNOSIS — M25652 Stiffness of left hip, not elsewhere classified: Secondary | ICD-10-CM

## 2018-04-21 DIAGNOSIS — M5416 Radiculopathy, lumbar region: Secondary | ICD-10-CM

## 2018-04-21 NOTE — Therapy (Signed)
[facility] [address], [address] [place], [place], [address] Phone: [phone]   Fax:  [phone]  Physical Therapy Treatment  Patient Details  Name: Brenda Mccall MRN: [id number] Date of Birth: [birth year] Referring Provider: Dr. Newell Coral    Encounter Date: 04/21/2018  PT End of Session - 04/21/18 1141    Visit Number  6    Date for PT Re-Evaluation  05/29/18    Authorization Type  Medicare    PT Start Time  1102    PT Stop Time  1143    PT Time Calculation (min)  41 min    Activity Tolerance  Patient tolerated treatment well    Behavior During Therapy  [facility] for tasks assessed/performed       Past Medical History:  Diagnosis Date  . Kidney stones   . Renal disorder     History reviewed. No pertinent surgical history.  There were no vitals filed for this visit.  Subjective Assessment - 04/21/18 1102    Subjective  I have been doing my exercises every morning.  I have walked up my steps after i practiced.     Currently in Pain?  Yes    Pain Score  0-No pain    Pain Location  Hip    Pain Orientation  Left    Pain Descriptors / Indicators  Aching;Sore;Tingling    Pain Type  Chronic pain    Pain Onset  More than a month ago    Pain Frequency  Intermittent    Aggravating Factors   lying down to sleep, steps, walking    Pain Relieving Factors  ice, heat, rest                       [facility] Adult PT Treatment/Exercise - 04/21/18 0001      Lumbar Exercises: Stretches   Hip Flexor Stretch  Left;2 reps;20 seconds      Lumbar Exercises: Aerobic   Nustep  Level 2x 8 minutes PT present to monitor      Lumbar Exercises: Standing   Other Standing Lumbar Exercises  6" step up on Lt x15, step down x 10      Lumbar Exercises: Supine   Other Supine Lumbar Exercises  ab bracing with horizontal abduction     Other Supine Lumbar Exercises  ball rolls with red ball: hip flexion and knee flexion single and double  leg               PT Short Term Goals - 04/09/18 1336      PT SHORT TERM GOAL #1   Title  The patient will demonstrate knowledge of initial HEP and self care strategies appropriate for current problem     Time  4    Period  Weeks    Status  On-going      PT SHORT TERM GOAL #2   Title  The patient will report a 30% reduction in back and hip pain with rising from the chair or bed    Time  4    Period  Weeks    Status  On-going      PT SHORT TERM GOAL #3   Title  Patient will have improved left hip internal rotation to 10 degrees and external rotation to 30 degrees for greater ease tying her shoe    Time  4    Period  Weeks    Status  On-going  PT SHORT TERM GOAL #4   Title  Improved hip flexion to 95 degrees and hip extension to 5 degrees for greater ease getting in/out of the car    Time  4    Period  Weeks    Status  On-going        PT Long Term Goals - 04/03/18 1231      PT LONG TERM GOAL #1   Title  The patient will be independent with safe, self progression of HEP      Time  8    Period  Weeks    Status  New    Target Date  05/29/18      PT LONG TERM GOAL #2   Title  The patient will report a 60% improvement in back and left hip/buttock pain with rising from the chair, bed or car     Time  8    Period  Weeks    Status  New      PT LONG TERM GOAL #3   Title  The patient will be able to walk 500 feet with minimal pain     Time  8    Period  Weeks    Status  New      PT LONG TERM GOAL #4   Title  Improved hip internal rotation ROM to 15 and external rotation to 40 degrees needed for tying shoes and getting down on the floor to play with grandkids    Time  8    Period  Weeks    Status  New      PT LONG TERM GOAL #5   Title  Core, hip and knee strength grossly 4 to 4+/5 needed to ascend and descend steps reciprocally.      Time  8    Period  Weeks    Status  New      Additional Long Term Goals   Additional Long Term Goals  Yes      PT  LONG TERM GOAL #6   Title  FOTO functional outcome score improved from 48% limitation to 43% indicating improved function with less pain     Time  8    Period  Weeks    Status  New            Plan - 04/21/18 1128    Clinical Impression Statement  Pt with significant improvement in mobility since last session.  Pt able to ascend steps with Lt LE with UE support and perform 6" step-down with bil UE support.  Pt has been performing all HEP and reports that all exercises feel much easier now.  Pt with 2/10 Lt hip pain reported today.  Pt tolerated low level exercise for strength and mobility without limitation today and required tactile and verbal cues.   TA activation today with tactile and verbal cues today.  Lt LE tremor was significantly improved today.  Pt will continue to benefit from skilled PT for core strength, gentle flexibility and manual as needed.        Rehab Potential  Good    PT Frequency  2x / week    PT Duration  8 weeks    PT Treatment/Interventions  ADLs/Self Care Home Management;Cryotherapy;Electrical Stimulation;Ultrasound;Moist Heat;Iontophoresis 4mg /ml Dexamethasone;Therapeutic activities;Therapeutic exercise;Neuromuscular re-education;Patient/family education;Manual techniques;Taping;Dry needling    PT Next Visit Plan  Core strenth, hip strength and flexibility, functional movement, manual and modaliteis as needed.    PT Home Exercise Plan   Access Code:  [email or web address]     Consulted and Agree with Plan of Care  Patient       Patient will benefit from skilled therapeutic intervention in order to improve the following deficits and impairments:  Pain, Increased fascial restricitons, Postural dysfunction, Decreased activity tolerance, Decreased range of motion, Decreased strength, Hypomobility, Impaired perceived functional ability, Difficulty walking  Visit Diagnosis: Radiculopathy, lumbar region  Stiffness of left hip, not elsewhere classified  Muscle weakness  (generalized)  Difficulty in walking, not elsewhere classified     Problem List There are no active problems to display for this patient.   Lorrene Reid, PT 04/21/18 11:47 AM  [facility] [address] [place], [place], [address] Phone: [phone]   Fax:  [phone]  Name: Brenda Mccall MRN: [id number] Date of Birth: Sep 27, 1950

## 2018-04-23 ENCOUNTER — Ambulatory Visit: Payer: Medicare Other

## 2018-04-23 DIAGNOSIS — M5416 Radiculopathy, lumbar region: Secondary | ICD-10-CM | POA: Diagnosis not present

## 2018-04-23 DIAGNOSIS — R262 Difficulty in walking, not elsewhere classified: Secondary | ICD-10-CM

## 2018-04-23 DIAGNOSIS — M6281 Muscle weakness (generalized): Secondary | ICD-10-CM

## 2018-04-23 DIAGNOSIS — M25652 Stiffness of left hip, not elsewhere classified: Secondary | ICD-10-CM

## 2018-04-23 NOTE — Therapy (Signed)
[facility] [address], [address] [place], [place], [address] Phone: [phone]   Fax:  [phone]  Physical Therapy Treatment  Patient Details  Name: Brenda Mccall MRN: [id number] Date of Birth: 1951/03/26 Referring Provider: Dr. Newell CoralNudelman    Encounter Date: 04/23/2018  PT End of Session - 04/23/18 1146    Visit Number  7    Date for PT Re-Evaluation  05/29/18    Authorization Type  Medicare    PT Start Time  1102    PT Stop Time  1146    PT Time Calculation (min)  44 min    Activity Tolerance  Patient tolerated treatment well    Behavior During Therapy  [facility] for tasks assessed/performed       Past Medical History:  Diagnosis Date  . Kidney stones   . Renal disorder     History reviewed. No pertinent surgical history.  There were no vitals filed for this visit.  Subjective Assessment - 04/23/18 1101    Subjective  I'm tired today.  I did a lot of housework yesterday.      Patient Stated Goals  I want to cross my leg and tie my shoe;  lift leg in shower to shave legs;  sit on the floor to play with grandchildren;  lose weight;  get back to gardening    Currently in Pain?  Yes    Pain Score  1     Pain Location  Hip    Pain Orientation  Left    Pain Descriptors / Indicators  Aching;Sore                       [facility] Adult PT Treatment/Exercise - 04/23/18 0001      Exercises   Exercises  Knee/Hip;Lumbar      Lumbar Exercises: Stretches   Active Hamstring Stretch  3 reps;20 seconds    Hip Flexor Stretch  Left;2 reps;20 seconds    Piriformis Stretch  Left;Right;3 reps;20 seconds      Lumbar Exercises: Aerobic   Nustep  Level 2x 10 minutes      Lumbar Exercises: Seated   Other Seated Lumbar Exercises  sit to stand : 2x10 with bracing      Lumbar Exercises: Supine   Other Supine Lumbar Exercises  ab bracing with horizontal abduction, D2 and ER with red band    Other Supine Lumbar Exercises  ball  rolls with red ball: hip flexion and knee flexion                         Plan - 04/23/18 1130    Clinical Impression Statement  Pt with significant improvement in mobility this week. Pt able to ascend steps with Lt LE with UE support and perform 6" step-down with bil UE support.  Pt has been performing all HEP and reports that all exercises feel much easier now.  Pt was able to perform housework yesterday with fatigue but no significant pain. Pt tolerated all advancement of exercise without limitation today and required tactile and verbal cues.   TA activation today with tactile and verbal cues today.  No Lt LE tremor with exercise today.   Pt will continue to benefit from skilled PT for core strength, gentle flexibility and manual as needed.     Rehab Potential  Good    PT Frequency  2x / week    PT Duration  8  weeks    PT Treatment/Interventions  ADLs/Self Care Home Management;Cryotherapy;Electrical Stimulation;Ultrasound;Moist Heat;Iontophoresis 4mg /ml Dexamethasone;Therapeutic activities;Therapeutic exercise;Neuromuscular re-education;Patient/family education;Manual techniques;Taping;Dry needling    PT Next Visit Plan  Core strenth, hip strength and flexibility, functional movement, manual and modaliteis as needed.    PT Home Exercise Plan   Access Code: [email or web address]     Consulted and Agree with Plan of Care  Patient       Patient will benefit from skilled therapeutic intervention in order to improve the following deficits and impairments:  Pain, Increased fascial restricitons, Postural dysfunction, Decreased activity tolerance, Decreased range of motion, Decreased strength, Hypomobility, Impaired perceived functional ability, Difficulty walking  Visit Diagnosis: Radiculopathy, lumbar region  Stiffness of left hip, not elsewhere classified  Muscle weakness (generalized)  Difficulty in walking, not elsewhere classified     Problem List There are no active problems to  display for this patient.  Lorrene Reid, PT 04/23/18 11:48 AM  [facility] [address] [place], [place], [address] Phone: [phone]   Fax:  [phone]  Name: Brenda Mccall MRN: [id number] Date of Birth: Oct 18, 1950

## 2018-04-28 ENCOUNTER — Ambulatory Visit: Payer: Medicare Other

## 2018-04-28 DIAGNOSIS — M5416 Radiculopathy, lumbar region: Secondary | ICD-10-CM

## 2018-04-28 DIAGNOSIS — M25652 Stiffness of left hip, not elsewhere classified: Secondary | ICD-10-CM

## 2018-04-28 DIAGNOSIS — R262 Difficulty in walking, not elsewhere classified: Secondary | ICD-10-CM

## 2018-04-28 DIAGNOSIS — M6281 Muscle weakness (generalized): Secondary | ICD-10-CM

## 2018-04-28 NOTE — Therapy (Signed)
[facility] [address] [place], [place], [address] Phone: [phone]   Fax:  [phone]  Physical Therapy Treatment  Patient Details  Name: Brenda Mccall MRN: [id number] Date of Birth: [birth year] Referring Provider: Dr. Newell Coral    Encounter Date: 04/28/2018  PT End of Session - 04/28/18 1140    Visit Number  8    Date for PT Re-Evaluation  05/29/18    Authorization Type  Medicare    PT Start Time  1101    PT Stop Time  1141    PT Time Calculation (min)  40 min    Activity Tolerance  Patient tolerated treatment well    Behavior During Therapy  [facility] for tasks assessed/performed       Past Medical History:  Diagnosis Date  . Kidney stones   . Renal disorder     History reviewed. No pertinent surgical history.  There were no vitals filed for this visit.  Subjective Assessment - 04/28/18 1103    Subjective  I'm feeling stiff and tired.  I was feeling good so I did a lot of housework.      Currently in Pain?  Yes    Pain Score  1     Pain Location  Hip    Pain Orientation  Left    Pain Descriptors / Indicators  Aching;Sore    Pain Type  Chronic pain    Pain Onset  More than a month ago    Pain Frequency  Intermittent    Aggravating Factors   too much activity, steps, walking    Pain Relieving Factors  ice, heat, stretching                       [facility] Adult PT Treatment/Exercise - 04/28/18 0001      Lumbar Exercises: Stretches   Active Hamstring Stretch  3 reps;20 seconds      Lumbar Exercises: Aerobic   Nustep  Level 2x 10 minutes   PT present to monitor     Lumbar Exercises: Seated   Other Seated Lumbar Exercises  sit to stand : 2x10 with bracing      Lumbar Exercises: Supine   Other Supine Lumbar Exercises  ab bracing with horizontal abduction, D2 and ER with red band    Other Supine Lumbar Exercises  ball rolls with red ball: hip flexion and knee flexion   single and double  leg            PT Education - 04/28/18 1119    Education Details   Access Code: [email or web address]     Person(s) Educated  Patient    Methods  Explanation;Demonstration;Handout    Comprehension  Verbalized understanding;Returned demonstration       PT Short Term Goals - 04/09/18 1336      PT SHORT TERM GOAL #1   Title  The patient will demonstrate knowledge of initial HEP and self care strategies appropriate for current problem     Time  4    Period  Weeks    Status  On-going      PT SHORT TERM GOAL #2   Title  The patient will report a 30% reduction in back and hip pain with rising from the chair or bed    Time  4    Period  Weeks    Status  On-going      PT SHORT TERM GOAL #3   Title  Patient  will have improved left hip internal rotation to 10 degrees and external rotation to 30 degrees for greater ease tying her shoe    Time  4    Period  Weeks    Status  On-going      PT SHORT TERM GOAL #4   Title  Improved hip flexion to 95 degrees and hip extension to 5 degrees for greater ease getting in/out of the car    Time  4    Period  Weeks    Status  On-going        PT Long Term Goals - 04/03/18 1231      PT LONG TERM GOAL #1   Title  The patient will be independent with safe, self progression of HEP      Time  8    Period  Weeks    Status  New    Target Date  05/29/18      PT LONG TERM GOAL #2   Title  The patient will report a 60% improvement in back and left hip/buttock pain with rising from the chair, bed or car     Time  8    Period  Weeks    Status  New      PT LONG TERM GOAL #3   Title  The patient will be able to walk 500 feet with minimal pain     Time  8    Period  Weeks    Status  New      PT LONG TERM GOAL #4   Title  Improved hip internal rotation ROM to 15 and external rotation to 40 degrees needed for tying shoes and getting down on the floor to play with grandkids    Time  8    Period  Weeks    Status  New      PT LONG TERM GOAL #5   Title   Core, hip and knee strength grossly 4 to 4+/5 needed to ascend and descend steps reciprocally.      Time  8    Period  Weeks    Status  New      Additional Long Term Goals   Additional Long Term Goals  Yes      PT LONG TERM GOAL #6   Title  FOTO functional outcome score improved from 48% limitation to 43% indicating improved function with less pain     Time  8    Period  Weeks    Status  New            Plan - 04/28/18 1114    Clinical Impression Statement  Pt with significant improvement endurance and ability to perform houswork.  Pt has been performing all HEP and reports that all exercises feel much easier now.  Pt was able to perform housework yesterday with fatigue but no significant pain. Pt tolerated all advancement of exercise without limitation today and required tactile and verbal cues.   TA activation today with tactile and verbal cues today. PT advanced HEP for core strength with supine theraband exercises.  Pt with mild antalgia with gait today due to overdoing it yesterday.   Pt will continue to benefit from skilled PT for core strength, gentle flexibility and manual as needed.      Rehab Potential  Good    PT Frequency  2x / week    PT Duration  8 weeks    PT Treatment/Interventions  ADLs/Self Care Home Management;Cryotherapy;Electrical Stimulation;Ultrasound;Moist  Heat;Iontophoresis 4mg /ml Dexamethasone;Therapeutic activities;Therapeutic exercise;Neuromuscular re-education;Patient/family education;Manual techniques;Taping;Dry needling    PT Next Visit Plan  Core strenth, hip strength and flexibility, functional movement, manual and modaliteis as needed.    PT Home Exercise Plan   Access Code: [email or web address]     Consulted and Agree with Plan of Care  Patient       Patient will benefit from skilled therapeutic intervention in order to improve the following deficits and impairments:  Pain, Increased fascial restricitons, Postural dysfunction, Decreased activity tolerance,  Decreased range of motion, Decreased strength, Hypomobility, Impaired perceived functional ability, Difficulty walking  Visit Diagnosis: Radiculopathy, lumbar region  Stiffness of left hip, not elsewhere classified  Muscle weakness (generalized)  Difficulty in walking, not elsewhere classified     Problem List There are no active problems to display for this patient.    Lorrene ReidKelly Shaiann Mcmanamon, PT 04/28/18 11:42 AM  [facility] [address], [address] [place], [place], [address] Phone: [phone]   Fax:  [phone]  Name: Brenda Mccall MRN: [id number] Date of Birth: 01-15-1951

## 2018-04-28 NOTE — Patient Instructions (Signed)
Access Code: [email or web address]  URL: [email or web address]  Date: 04/28/2018  Prepared by: Lorrene ReidKelly Meriah Shands   Exercises  Supine Shoulder Horizontal Abduction with Resistance - 10 reps - 2 sets - 2x daily - 7x weekly  Supine Bilateral Shoulder External Rotation with Resistance - 10 reps - 2 sets - 2x daily - 7x weekly  Supine PNF D2 Flexion with Resistance - 10 reps - 2 sets - 2x daily - 7x weekly

## 2018-04-30 ENCOUNTER — Ambulatory Visit: Payer: Medicare Other

## 2018-04-30 DIAGNOSIS — R262 Difficulty in walking, not elsewhere classified: Secondary | ICD-10-CM

## 2018-04-30 DIAGNOSIS — M25652 Stiffness of left hip, not elsewhere classified: Secondary | ICD-10-CM

## 2018-04-30 DIAGNOSIS — M6281 Muscle weakness (generalized): Secondary | ICD-10-CM

## 2018-04-30 DIAGNOSIS — M5416 Radiculopathy, lumbar region: Secondary | ICD-10-CM

## 2018-04-30 NOTE — Therapy (Signed)
[facility] [address], [address] [place], [place], [address] Phone: [phone]   Fax:  [phone]  Physical Therapy Treatment  Patient Details  Name: Brenda Mccall MRN: [id number] Date of Birth: 03-Nov-1950 Referring Provider: Dr. Newell CoralNudelman    Encounter Date: 04/30/2018  PT End of Session - 04/30/18 1147    Visit Number  9    Date for PT Re-Evaluation  05/29/18    Authorization Type  Medicare    PT Start Time  1103    PT Stop Time  1146    PT Time Calculation (min)  43 min    Activity Tolerance  Patient tolerated treatment well    Behavior During Therapy  [facility] for tasks assessed/performed       Past Medical History:  Diagnosis Date  . Kidney stones   . Renal disorder     History reviewed. No pertinent surgical history.  There were no vitals filed for this visit.  Subjective Assessment - 04/30/18 1116    Subjective  I feel better today.  Yesterday was a bad day.      Patient Stated Goals  I want to cross my leg and tie my shoe;  lift leg in shower to shave legs;  sit on the floor to play with grandchildren;  lose weight;  get back to gardening    Pain Score  0-No pain                       [facility] Adult PT Treatment/Exercise - 04/30/18 0001      Exercises   Exercises  Knee/Hip;Lumbar      Lumbar Exercises: Stretches   Active Hamstring Stretch  3 reps;20 seconds      Lumbar Exercises: Aerobic   Nustep  Level 2x 10 minutes   PT present to monitor     Lumbar Exercises: Seated   Other Seated Lumbar Exercises  sit to stand : 2x10 with bracing   pain with sit to stand     Lumbar Exercises: Supine   Other Supine Lumbar Exercises  ab bracing with horizontal abduction, D2 and ER with green band    Other Supine Lumbar Exercises  ball rolls with red ball: hip flexion and knee flexion   single and double leg     Knee/Hip Exercises: Supine   Other Supine Knee/Hip Exercises  decompression exercise  series             PT Education - 04/30/18 1134    Education Details  decompression exercises    Person(s) Educated  Patient    Methods  Explanation;Demonstration;Handout    Comprehension  Verbalized understanding;Returned demonstration       PT Short Term Goals - 04/30/18 1149      PT SHORT TERM GOAL #1   Title  The patient will demonstrate knowledge of initial HEP and self care strategies appropriate for current problem     Status  Achieved      PT SHORT TERM GOAL #2   Title  The patient will report a 30% reduction in back and hip pain with rising from the chair or bed    Status  Achieved        PT Long Term Goals - 04/30/18 1149      PT LONG TERM GOAL #3   Title  The patient will be able to walk 500 feet with minimal pain     Baseline  this varies  Time  8    Period  Weeks    Status  On-going            Plan - 04/30/18 1123    Clinical Impression Statement   Pt with significant improvement endurance and ability to perform houswork.  Pt has been performing all HEP and reports that all exercises feel much easier now.  Pt reports intermittent Lt buttock and back pain and she is not sure of the cause.  Pt reports Lt gluteal pain with coming from stand to sit.  Pt tolerated all advancement of exercise without limitation today and required tactile and verbal cues.   TA activation today with tactile and verbal cues today. PT advanced HEP for core strength with supine theraband exercises this week.   Pt is walking with mild antalgia and this is improved from last visit. Pt will continue to benefit from skilled PT for core strength, gentle flexibility and manual as needed.       PT Frequency  2x / week    PT Duration  8 weeks    PT Treatment/Interventions  ADLs/Self Care Home Management;Cryotherapy;Electrical Stimulation;Ultrasound;Moist Heat;Iontophoresis 4mg /ml Dexamethasone;Therapeutic activities;Therapeutic exercise;Neuromuscular re-education;Patient/family  education;Manual techniques;Taping;Dry needling    PT Next Visit Plan  Core strenth, hip strength and flexibility, functional movement, manual and modaliteis as needed.  FOTO and 10th visit progress note    PT Home Exercise Plan   Access Code: [email or web address]     Recommended Other Services  initial certification is signed    Consulted and Agree with Plan of Care  Patient       Patient will benefit from skilled therapeutic intervention in order to improve the following deficits and impairments:  Pain, Increased fascial restricitons, Postural dysfunction, Decreased activity tolerance, Decreased range of motion, Decreased strength, Hypomobility, Impaired perceived functional ability, Difficulty walking  Visit Diagnosis: Radiculopathy, lumbar region  Stiffness of left hip, not elsewhere classified  Muscle weakness (generalized)  Difficulty in walking, not elsewhere classified     Problem List There are no active problems to display for this patient.   Lorrene ReidKelly Aiza Vollrath, PT 04/30/18 11:49 AM  [facility] [address], [address] [place], [place], [address] Phone: [phone]   Fax:  [phone]  Name: Brenda Mccall MRN: [id number] Date of Birth: 1951/02/16

## 2018-04-30 NOTE — Patient Instructions (Addendum)
RE-ALIGNMENT ROUTINE EXERCISES-OSTEOPROROSIS BASIC FOR POSTURAL CORRECTION   RE-ALIGNMENT Tips BENEFITS: 1.It helps to re-align the curves of the back and improve standing posture. 2.It allows the back muscles to rest and strengthen in preparation for more activity. FREQUENCY: Daily, even after weeks, months and years of more advanced exercises. START: 1.All exercises start in the same position: lying on the back, arms resting on the supporting surface, palms up and slightly away from the body, backs of hands down, knees bent, feet flat. 2.The head, neck, arms, and legs are supported according to specific instructions of your therapist. Copyright  VHI. All rights reserved.    1. Decompression Exercise: Basic.   Takes compression off the vertebral bodies; increases tolerance for lying on the back; helps relieve back pain   Lie on back on firm surface, knees bent, feet flat, arms turned up, out to sides (~35 degrees). Head neck and arms supported as necessary. Time _5-15__ minutes. Surface: floor     2. Shoulder Press  Strengthens upper back extensors and scapular retractors.   Press both shoulders down. Hold _2-3__ seconds. Repeat _3-5__ times. Surface: floor     4. Leg Lengthener: stretches quadratus lumborum and hip flexors.  Strengthens quads and ankle dorsiflexors.  Leg Lengthener / Leg Press Combo: Single Leg    Straighten one leg down to floor. Pull toes AND forefoot toward knee; extend heel. Lengthen leg by pulling pelvis away from ribs. Press leg down. DO NOT BEND KNEE. Hold ___ seconds. Relax leg. Repeat exercise 1 time. Relax leg. Re-bend knee. Repeat with other leg.  Copyright  VHI. All rights reserved.  [facility] [address],[facility] [address] [place], [place] [address] Phone # [phone] Fax [phone]

## 2018-05-05 ENCOUNTER — Ambulatory Visit: Payer: Medicare Other | Admitting: Physical Therapy

## 2018-05-05 ENCOUNTER — Encounter: Payer: Self-pay | Admitting: Physical Therapy

## 2018-05-05 DIAGNOSIS — M5416 Radiculopathy, lumbar region: Secondary | ICD-10-CM

## 2018-05-05 DIAGNOSIS — M25652 Stiffness of left hip, not elsewhere classified: Secondary | ICD-10-CM

## 2018-05-05 DIAGNOSIS — M6281 Muscle weakness (generalized): Secondary | ICD-10-CM

## 2018-05-05 DIAGNOSIS — R262 Difficulty in walking, not elsewhere classified: Secondary | ICD-10-CM

## 2018-05-05 NOTE — Patient Instructions (Signed)
Medbridge prone press ups, standing extension, wall side glides   Lavinia SharpsStacy Simpson PT [facility] [address], [address] [place], [place] [address] Phone # [phone] Fax [phone]

## 2018-05-05 NOTE — Therapy (Signed)
[facility] [address] [place], [place], [address] Phone: [phone]   Fax:  [phone]  Physical Therapy Treatment  Patient Details  Name: Brenda Mccall MRN: [id number] Date of Birth: January 01, 1951 Referring Provider: Dr. Sherwood Gambler    Encounter Date: 05/05/2018  PT End of Session - 05/05/18 1311    Visit Number  10    Date for PT Re-Evaluation  05/29/18    Authorization Type  Medicare    PT Start Time  1145    PT Stop Time  1230    PT Time Calculation (min)  45 min    Activity Tolerance  Patient tolerated treatment well      Progress Note Reporting Period 04/03/18 to 05/05/18  See note below for Objective Data and Assessment of Progress/Goals.      Past Medical History:  Diagnosis Date  . Kidney stones   . Renal disorder     History reviewed. No pertinent surgical history.  There were no vitals filed for this visit.  Subjective Assessment - 05/05/18 1144    Subjective  Had a rough day Sunday, had to take a Tramadol and go to bed.  Once I got up I was OK.  Fine sitting, but sit to stand and walking is painful.  Shifting weight onto it hurts.  My husband says I walk leaning to the side.   I can get my leg up to shave better.  I can do the ball exercises for 2 minutes at home now.  States, "I'm moving better now but I want the pain to be better."      Patient Stated Goals  I want to cross my leg and tie my shoe;  lift leg in shower to shave legs;  sit on the floor to play with grandchildren;  lose weight;  get back to gardening    Currently in Pain?  Yes    Pain Score  3     Pain Location  Hip    Pain Orientation  Left         [facility] PT Assessment - 05/05/18 0001      Observation/Other Assessments   Focus on Therapeutic Outcomes (FOTO)   43% limitation      AROM   Left Hip Extension  5    Left Hip Flexion  120    Left Hip External Rotation   35    Left Hip Internal Rotation   5    Lumbar Flexion  62     Lumbar Extension  15    Lumbar - Right Side Bend  30    Lumbar - Left Side Bend  30      Strength   Right Hip Extension  4/5    Right Hip External Rotation   4/5    Left Hip Flexion  4-/5    Left Hip Extension  4-/5    Left Knee Flexion  4/5    Left Knee Extension  4-/5                   [facility] Adult PT Treatment/Exercise - 05/05/18 0001      Lumbar Exercises: Standing   Other Standing Lumbar Exercises  right wall side glides 2x10    Other Standing Lumbar Exercises  standing lumbar extension in front of the counter 10x      Lumbar Exercises: Prone   Other Prone Lumbar Exercises  press ups 2x8      Manual Therapy  Manual therapy comments  passive left knee flexion 10x    Joint Mobilization  prone left hip IR and ER with gentle hip glides 2x 10 each              PT Education - 05/05/18 1310    Education Details  prone press ups, standing lumbar extension, wall side glides right    Person(s) Educated  Patient    Methods  Explanation;Demonstration;Handout    Comprehension  Returned demonstration;Verbalized understanding       PT Short Term Goals - 05/05/18 1701      PT SHORT TERM GOAL #1   Title  The patient will demonstrate knowledge of initial HEP and self care strategies appropriate for current problem     Status  Achieved      PT SHORT TERM GOAL #2   Title  The patient will report a 30% reduction in back and hip pain with rising from the chair or bed    Status  Achieved      PT SHORT TERM GOAL #3   Title  Patient will have improved left hip internal rotation to 10 degrees and external rotation to 30 degrees for greater ease tying her shoe    Status  Achieved      PT SHORT TERM GOAL #4   Title  Improved hip flexion to 95 degrees and hip extension to 5 degrees for greater ease getting in/out of the car    Status  Achieved        PT Long Term Goals - 04/30/18 1149      PT LONG TERM GOAL #3   Title  The patient will be able to walk 500  feet with minimal pain     Baseline  this varies    Time  8    Period  Weeks    Status  On-going            Plan - 05/05/18 1207    Clinical Impression Statement  The patient has made significant improvements in left hip ROM in all planes but especially with hip flexion and external rotation improving her functional ability to put on her shoes and shave her legs.  Her FOTO functional outcome score has improved as well.  She is still painful with ambulation and sit to stand transition.     No change in lateral shift correction ex.  Good initial response to press ups ex's.  She was unable on initial evaluation to tolerate this motion.  Will initiate trial of extensions every 2 hours of the next 2 days in attempt to centralize symptoms and possibly help with lateral shift.  Therapist closely monitoring response with all interventions.  All STGs met.     Rehab Potential  Good    PT Frequency  2x / week    PT Duration  8 weeks    PT Treatment/Interventions  ADLs/Self Care Home Management;Cryotherapy;Electrical Stimulation;Ultrasound;Moist Heat;Iontophoresis 47m/ml Dexamethasone;Therapeutic activities;Therapeutic exercise;Neuromuscular re-education;Patient/family education;Manual techniques;Taping;Dry needling    PT Next Visit Plan  assess response to prone press ups for centralization and shift correction;  Core strength, hip strength hip ROM internal and external rotation;  and flexibility, functional movement    PT Home Exercise Plan   Access Code: [email or web address]       Patient will benefit from skilled therapeutic intervention in order to improve the following deficits and impairments:  Pain, Increased fascial restricitons, Postural dysfunction, Decreased activity tolerance, Decreased range of motion, Decreased strength, Hypomobility, Impaired  perceived functional ability, Difficulty walking  Visit Diagnosis: Radiculopathy, lumbar region  Stiffness of left hip, not elsewhere classified  Muscle  weakness (generalized)  Difficulty in walking, not elsewhere classified     Problem List There are no active problems to display for this patient.  Ruben Im, PT 05/05/18 5:07 PM Phone: [phone] Fax: [phone]  Alvera Singh 05/05/2018, Peter Garter PM  [facility] [address] [place], [place], [address] Phone: [phone]   Fax:  [phone]  Name: Brenda Mccall MRN: [id number] Date of Birth: 11-Jul-1951

## 2018-05-07 ENCOUNTER — Ambulatory Visit: Payer: Medicare Other

## 2018-05-07 DIAGNOSIS — M25652 Stiffness of left hip, not elsewhere classified: Secondary | ICD-10-CM

## 2018-05-07 DIAGNOSIS — R262 Difficulty in walking, not elsewhere classified: Secondary | ICD-10-CM

## 2018-05-07 DIAGNOSIS — M5416 Radiculopathy, lumbar region: Secondary | ICD-10-CM

## 2018-05-07 DIAGNOSIS — M6281 Muscle weakness (generalized): Secondary | ICD-10-CM

## 2018-05-07 NOTE — Therapy (Signed)
[facility] [address] [place], [place], [address] Phone: [phone]   Fax:  [phone]  Physical Therapy Treatment  Patient Details  Name: Brenda Mccall MRN: [id number] Date of Birth: 09/08/1951 Referring Provider: Dr. Newell Coral    Encounter Date: 05/07/2018  PT End of Session - 05/07/18 1138    Visit Number  11    Date for PT Re-Evaluation  05/29/18    Authorization Type  Medicare    PT Start Time  1103    PT Stop Time  1145    PT Time Calculation (min)  42 min    Activity Tolerance  Patient tolerated treatment well    Behavior During Therapy  [facility] for tasks assessed/performed       Past Medical History:  Diagnosis Date  . Kidney stones   . Renal disorder     History reviewed. No pertinent surgical history.  There were no vitals filed for this visit.  Subjective Assessment - 05/07/18 1106    Subjective  I have the most pain with getting up from standing/getting out of car.  My arms are sore with I try to do the prone press up.      Currently in Pain?  Yes    Pain Score  3    0/10 when sitting   Pain Location  Hip    Pain Orientation  Left    Pain Descriptors / Indicators  Aching;Sore    Pain Type  Chronic pain    Pain Onset  More than a month ago    Pain Frequency  Intermittent    Aggravating Factors   change of positon    Pain Relieving Factors  ice, heat, stretching, sitting                       [facility] Adult PT Treatment/Exercise - 05/07/18 0001      Exercises   Exercises  Knee/Hip;Lumbar      Lumbar Exercises: Stretches   Active Hamstring Stretch  3 reps;20 seconds      Lumbar Exercises: Aerobic   Nustep  Level 2x 10 minutes   PT present to monitor     Lumbar Exercises: Seated   Other Seated Lumbar Exercises  3 way raises: 1# with abdominal bracing 2x10 each    Other Seated Lumbar Exercises  sit to stand : 2x10 with bracing   pain with sit to stand     Lumbar  Exercises: Supine   Ab Set  20 reps;5 seconds   with ball squeeze   Other Supine Lumbar Exercises  ball rolls with red ball: hip flexion and knee flexion   double leg     Lumbar Exercises: Prone   Straight Leg Raise  20 reps    Other Prone Lumbar Exercises  press ups x15-partial range due to shoulder pain               PT Short Term Goals - 05/05/18 1701      PT SHORT TERM GOAL #1   Title  The patient will demonstrate knowledge of initial HEP and self care strategies appropriate for current problem     Status  Achieved      PT SHORT TERM GOAL #2   Title  The patient will report a 30% reduction in back and hip pain with rising from the chair or bed    Status  Achieved      PT SHORT TERM GOAL #  3   Title  Patient will have improved left hip internal rotation to 10 degrees and external rotation to 30 degrees for greater ease tying her shoe    Status  Achieved      PT SHORT TERM GOAL #4   Title  Improved hip flexion to 95 degrees and hip extension to 5 degrees for greater ease getting in/out of the car    Status  Achieved        PT Long Term Goals - 05/07/18 1134      PT LONG TERM GOAL #2   Title  The patient will report a 60% improvement in back and left hip/buttock pain with rising from the chair, bed or car     Baseline  50%    Time  8    Period  Weeks    Status  On-going            Plan - 05/07/18 1129    Clinical Impression Statement  Pt reports that she feels 50% improvement since the start of care.  Pt reports most pain and difficulty with sit to stand transition.  Pt is using standing extension exercise to reduce this pain upon standing.  Pt is working to improve her hip and core strength and overall flexibility and mobility s/p lumbar surgery.  Pt requires verbal cues to slow her movement with exercise.  Pt will continue to benefit from skilled PT to improve function, mobility and reduce pain s/p lumbar surgery.      Rehab Potential  Good    PT  Frequency  2x / week    PT Duration  8 weeks    PT Treatment/Interventions  ADLs/Self Care Home Management;Cryotherapy;Electrical Stimulation;Ultrasound;Moist Heat;Iontophoresis 4mg /ml Dexamethasone;Therapeutic activities;Therapeutic exercise;Neuromuscular re-education;Patient/family education;Manual techniques;Taping;Dry needling    PT Next Visit Plan  core strength, extension based exercise as helpful, functional movement    PT Home Exercise Plan   Access Code: [email or web address]     Consulted and Agree with Plan of Care  Patient       Patient will benefit from skilled therapeutic intervention in order to improve the following deficits and impairments:  Pain, Increased fascial restricitons, Postural dysfunction, Decreased activity tolerance, Decreased range of motion, Decreased strength, Hypomobility, Impaired perceived functional ability, Difficulty walking  Visit Diagnosis: Radiculopathy, lumbar region  Stiffness of left hip, not elsewhere classified  Muscle weakness (generalized)  Difficulty in walking, not elsewhere classified     Problem List There are no active problems to display for this patient.   Lorrene ReidKelly Charleene Callegari, PT 05/07/18 11:40 AM  [facility] [address], [address] [place], [place], [address] Phone: [phone]   Fax:  [phone]  Name: Geri SeminoleRuth Difatta MRN: [id number] Date of Birth: 04/22/1951

## 2018-05-12 ENCOUNTER — Ambulatory Visit: Payer: Medicare Other

## 2018-05-12 DIAGNOSIS — M25652 Stiffness of left hip, not elsewhere classified: Secondary | ICD-10-CM

## 2018-05-12 DIAGNOSIS — M5416 Radiculopathy, lumbar region: Secondary | ICD-10-CM

## 2018-05-12 DIAGNOSIS — R262 Difficulty in walking, not elsewhere classified: Secondary | ICD-10-CM

## 2018-05-12 DIAGNOSIS — M6281 Muscle weakness (generalized): Secondary | ICD-10-CM

## 2018-05-12 NOTE — Therapy (Signed)
[facility] [address] [place], [place], [address] Phone: [phone]   Fax:  [phone]  Physical Therapy Treatment  Patient Details  Name: Alinda Egolf MRN: [id number] Date of Birth: 01-Sep-1951 Referring Provider: Dr. Newell Coral    Encounter Date: 05/12/2018  PT End of Session - 05/12/18 1142    Visit Number  12    Date for PT Re-Evaluation  05/29/18    Authorization Type  Medicare    PT Start Time  1102    PT Stop Time  1143    PT Time Calculation (min)  41 min    Activity Tolerance  Patient tolerated treatment well    Behavior During Therapy  [facility] for tasks assessed/performed       Past Medical History:  Diagnosis Date  . Kidney stones   . Renal disorder     History reviewed. No pertinent surgical history.  There were no vitals filed for this visit.  Subjective Assessment - 05/12/18 1105    Subjective  I have learned to stand and wait a minute before moving.  Prone on elbows is helping.     Currently in Pain?  No/denies                       [facility] Adult PT Treatment/Exercise - 05/12/18 0001      Exercises   Exercises  Knee/Hip;Lumbar      Lumbar Exercises: Stretches   Active Hamstring Stretch  3 reps;20 seconds      Lumbar Exercises: Aerobic   UBE (Upper Arm Bike)  Level 1x 6 minutes    Nustep  Level 2x 10 minutes   PT present to monitor     Lumbar Exercises: Seated   Other Seated Lumbar Exercises  3 way raises: 1# with abdominal bracing 2x10 each      Lumbar Exercises: Prone   Straight Leg Raise  20 reps               PT Short Term Goals - 05/05/18 1701      PT SHORT TERM GOAL #1   Title  The patient will demonstrate knowledge of initial HEP and self care strategies appropriate for current problem     Status  Achieved      PT SHORT TERM GOAL #2   Title  The patient will report a 30% reduction in back and hip pain with rising from the chair or bed    Status   Achieved      PT SHORT TERM GOAL #3   Title  Patient will have improved left hip internal rotation to 10 degrees and external rotation to 30 degrees for greater ease tying her shoe    Status  Achieved      PT SHORT TERM GOAL #4   Title  Improved hip flexion to 95 degrees and hip extension to 5 degrees for greater ease getting in/out of the car    Status  Achieved        PT Long Term Goals - 05/07/18 1134      PT LONG TERM GOAL #2   Title  The patient will report a 60% improvement in back and left hip/buttock pain with rising from the chair, bed or car     Baseline  50%    Time  8    Period  Weeks    Status  On-going  Plan - 05/12/18 1122    Clinical Impression Statement  Pt denies any pain since the last session.  Pt is working on prone on elbows and is performing standing extension upon standing and gait pattern has normalized.  Pt reports that she feels 50% improvement since the start of care.  Pt with improved hip extension in prone today.   Pt is working to improve her hip and core strength and overall flexibility and mobility s/p lumbar surgery.  Pt requires verbal cues to slow her movement with exercise.  Pt will continue to benefit from skilled PT to improve function, mobility and reduce pain s/p lumbar surgery.       Rehab Potential  Good    PT Frequency  2x / week    PT Duration  8 weeks    PT Treatment/Interventions  ADLs/Self Care Home Management;Cryotherapy;Electrical Stimulation;Ultrasound;Moist Heat;Iontophoresis 4mg /ml Dexamethasone;Therapeutic activities;Therapeutic exercise;Neuromuscular re-education;Patient/family education;Manual techniques;Taping;Dry needling    PT Next Visit Plan  core strength, extension based exercise as helpful, functional movement    PT Home Exercise Plan   Access Code: [email or web address]     Consulted and Agree with Plan of Care  Patient       Patient will benefit from skilled therapeutic intervention in order to improve the  following deficits and impairments:  Pain, Increased fascial restricitons, Postural dysfunction, Decreased activity tolerance, Decreased range of motion, Decreased strength, Hypomobility, Impaired perceived functional ability, Difficulty walking  Visit Diagnosis: Radiculopathy, lumbar region  Stiffness of left hip, not elsewhere classified  Muscle weakness (generalized)  Difficulty in walking, not elsewhere classified     Problem List There are no active problems to display for this patient.    Lorrene ReidKelly Takacs, PT 05/12/18 11:44 AM  [facility] [address], [address] [place], [place], [address] Phone: [phone]   Fax:  [phone]  Name: Geri SeminoleRuth Radilla MRN: [id number] Date of Birth: June 18, 1951

## 2018-05-14 ENCOUNTER — Encounter: Payer: Self-pay | Admitting: Physical Therapy

## 2018-05-14 ENCOUNTER — Ambulatory Visit: Payer: Medicare Other | Admitting: Physical Therapy

## 2018-05-14 DIAGNOSIS — M5416 Radiculopathy, lumbar region: Secondary | ICD-10-CM

## 2018-05-14 DIAGNOSIS — R262 Difficulty in walking, not elsewhere classified: Secondary | ICD-10-CM

## 2018-05-14 DIAGNOSIS — M6281 Muscle weakness (generalized): Secondary | ICD-10-CM

## 2018-05-14 DIAGNOSIS — M25652 Stiffness of left hip, not elsewhere classified: Secondary | ICD-10-CM

## 2018-05-14 NOTE — Therapy (Signed)
[facility] [address] [place], [place], [address] Phone: [phone]   Fax:  [phone]  Physical Therapy Treatment  Patient Details  Name: Brenda Mccall MRN: [id number] Date of Birth: 1951-06-29 Referring Provider: Dr. Newell Coral    Encounter Date: 05/14/2018  PT End of Session - 05/14/18 1120    Visit Number  13    Date for PT Re-Evaluation  05/29/18    Authorization Type  Medicare    PT Start Time  1102    PT Stop Time  1140    PT Time Calculation (min)  38 min    Activity Tolerance  Patient tolerated treatment well       Past Medical History:  Diagnosis Date  . Kidney stones   . Renal disorder     History reviewed. No pertinent surgical history.  There were no vitals filed for this visit.  Subjective Assessment - 05/14/18 1058    Subjective   I felt good all weekend.  I had a massage yesterday and that really helped.  Patient demonstrates her ability to walk without a limp.  No pain right now but states she was a little slow getting out of the car.   I  got some hand weights to use  at home.      Pertinent History  had PT in [place] before surgery which didn't help;  traction worsened; lumbar discectomy/laminectomy March 2019    How long can you walk comfortably?  today I think I could walk around that traffic circle;  walking 1/4 on treadmill    Currently in Pain?  No/denies    Pain Score  0-No pain         [facility] PT Assessment - 05/14/18 0001      Flexibility   Hamstrings  90 degrees bil                    [facility] Adult PT Treatment/Exercise - 05/14/18 0001      Lumbar Exercises: Stretches   Active Hamstring Stretch  3 reps;20 seconds   supine with strap     Lumbar Exercises: Aerobic   UBE (Upper Arm Bike)  --    Nustep  Level 2x 10 minutes   PT present to monitor     Lumbar Exercises: Machines for Strengthening   Cybex Knee Flexion  20# bil 20x    Leg Press  seat 6 60# bil; 30#  15x each    2nd set of single leg with 40#     Lumbar Exercises: Seated   Other Seated Lumbar Exercises  3 way raises: 1# with abdominal bracing 2x10 each    Other Seated Lumbar Exercises  5x      Lumbar Exercises: Prone   Straight Leg Raise  20 reps    Other Prone Lumbar Exercises  press ups x15-partial range due to shoulder pain               PT Short Term Goals - 05/05/18 1701      PT SHORT TERM GOAL #1   Title  The patient will demonstrate knowledge of initial HEP and self care strategies appropriate for current problem     Status  Achieved      PT SHORT TERM GOAL #2   Title  The patient will report a 30% reduction in back and hip pain with rising from the chair or bed    Status  Achieved  PT SHORT TERM GOAL #3   Title  Patient will have improved left hip internal rotation to 10 degrees and external rotation to 30 degrees for greater ease tying her shoe    Status  Achieved      PT SHORT TERM GOAL #4   Title  Improved hip flexion to 95 degrees and hip extension to 5 degrees for greater ease getting in/out of the car    Status  Achieved        PT Long Term Goals - 05/07/18 1134      PT LONG TERM GOAL #2   Title  The patient will report a 60% improvement in back and left hip/buttock pain with rising from the chair, bed or car     Baseline  50%    Time  8    Period  Weeks    Status  On-going            Plan - 05/14/18 1128    Clinical Impression Statement  The patient is ambulating with a normal gait pattern and without any pain behaviors with sit to stand.  She is now walking on her treadmill 1/4 mile and is participating in more leisure activities (sewing and quilting).  Much improved hip ROM although left hip external rotation more limited than right.  HS length 90 degrees bilaterally.  Therapist closely monitoring response with all interventions.  She should meet remaining rehab goals in the next 2 weeks.      Rehab Potential  Good    PT Frequency   2x / week    PT Duration  8 weeks    PT Treatment/Interventions  ADLs/Self Care Home Management;Cryotherapy;Electrical Stimulation;Ultrasound;Moist Heat;Iontophoresis 4mg /ml Dexamethasone;Therapeutic activities;Therapeutic exercise;Neuromuscular re-education;Patient/family education;Manual techniques;Taping;Dry needling    PT Next Visit Plan  core strength,  functional strength;  leg press    PT Home Exercise Plan   Access Code: [email or web address]        Patient will benefit from skilled therapeutic intervention in order to improve the following deficits and impairments:  Pain, Increased fascial restricitons, Postural dysfunction, Decreased activity tolerance, Decreased range of motion, Decreased strength, Hypomobility, Impaired perceived functional ability, Difficulty walking  Visit Diagnosis: Radiculopathy, lumbar region  Stiffness of left hip, not elsewhere classified  Muscle weakness (generalized)  Difficulty in walking, not elsewhere classified     Problem List There are no active problems to display for this patient.  Lavinia SharpsStacy Simpson, PT 05/14/18 11:53 AM Phone: [phone] Fax: [phone]  Vivien PrestoSimpson, Stacy C 05/14/2018, 11:52 AM  [facility] [address], [address] [place], [place], [address] Phone: [phone]   Fax:  [phone]  Name: Geri SeminoleRuth Buster MRN: [id number] Date of Birth: 08/17/1951

## 2018-05-22 ENCOUNTER — Encounter: Payer: Self-pay | Admitting: Physical Therapy

## 2018-05-22 ENCOUNTER — Encounter

## 2018-05-22 ENCOUNTER — Ambulatory Visit: Payer: Medicare Other | Attending: Neurosurgery | Admitting: Physical Therapy

## 2018-05-22 DIAGNOSIS — R262 Difficulty in walking, not elsewhere classified: Secondary | ICD-10-CM | POA: Insufficient documentation

## 2018-05-22 DIAGNOSIS — M25652 Stiffness of left hip, not elsewhere classified: Secondary | ICD-10-CM | POA: Diagnosis present

## 2018-05-22 DIAGNOSIS — M5416 Radiculopathy, lumbar region: Secondary | ICD-10-CM | POA: Insufficient documentation

## 2018-05-22 DIAGNOSIS — M6281 Muscle weakness (generalized): Secondary | ICD-10-CM | POA: Diagnosis present

## 2018-05-22 NOTE — Therapy (Signed)
[facility] [address], [address] [place], [place], [address] Phone: [phone]   Fax:  [phone]  Physical Therapy Treatment  Patient Details  Name: Brenda Mccall MRN: [id number] Date of Birth: 12/05/1950 Referring Provider: Dr. Newell Coral    Encounter Date: 05/22/2018  PT End of Session - 05/22/18 1052    Visit Number  14    Date for PT Re-Evaluation  05/29/18    Authorization Type  Medicare    PT Start Time  1016    PT Stop Time  1054    PT Time Calculation (min)  38 min    Activity Tolerance  Patient tolerated treatment well       Past Medical History:  Diagnosis Date  . Kidney stones   . Renal disorder     History reviewed. No pertinent surgical history.  There were no vitals filed for this visit.  Subjective Assessment - 05/22/18 1018    Subjective  I feel pretty good today.  I had some pain yesterday and when I got up this morning.  Once I get going I feel better.  I was sore in a good way after the strengthening exercises last time.    Pertinent History  had PT in [place] before surgery which didn't help;  traction worsened; lumbar discectomy/laminectomy March 2019    Currently in Pain?  Yes    Pain Score  1     Pain Location  Hip    Pain Orientation  Left    Pain Descriptors / Indicators  Sore    Pain Type  Chronic pain    Pain Onset  More than a month ago    Pain Frequency  Intermittent    Pain Relieving Factors  movement                       [facility] Adult PT Treatment/Exercise - 05/22/18 0001      Exercises   Exercises  Knee/Hip;Lumbar      Lumbar Exercises: Aerobic   UBE (Upper Arm Bike)  Level 1x 5 minutes   seated on red ball   Nustep  Level 2x 10 minutes   PT present to monitor     Lumbar Exercises: Machines for Strengthening   Cybex Knee Flexion  20# bil 20x    Leg Press  seat 6 60# bil; 40# 15x each     Other Lumbar Machine Exercise  15# lat pulldowns 1x10, 15# rows 1 x 10       Lumbar Exercises: Prone   Straight Leg Raise  20 reps   alternating Rt/Lt   Other Prone Lumbar Exercises  bent knee hip extension x 10 reps, 5 seconds   alternating Rt/Lt     Lumbar Exercises: Quadruped   Other Quadruped Lumbar Exercises  alternating hip extension x 10 Rt/Lt               PT Short Term Goals - 05/05/18 1701      PT SHORT TERM GOAL #1   Title  The patient will demonstrate knowledge of initial HEP and self care strategies appropriate for current problem     Status  Achieved      PT SHORT TERM GOAL #2   Title  The patient will report a 30% reduction in back and hip pain with rising from the chair or bed    Status  Achieved      PT SHORT TERM GOAL #3  Title  Patient will have improved left hip internal rotation to 10 degrees and external rotation to 30 degrees for greater ease tying her shoe    Status  Achieved      PT SHORT TERM GOAL #4   Title  Improved hip flexion to 95 degrees and hip extension to 5 degrees for greater ease getting in/out of the car    Status  Achieved        PT Long Term Goals - 05/07/18 1134      PT LONG TERM GOAL #2   Title  The patient will report a 60% improvement in back and left hip/buttock pain with rising from the chair, bed or car     Baseline  50%    Time  8    Period  Weeks    Status  On-going            Plan - 05/22/18 1101    Clinical Impression Statement  The patient consistently reports 0-2/10 pain.  Her pain is generally experienced in the AM on waking but her pain reduces to 0/10 with movement and activity.  She performed endurance aerobic activities and therapeutic strenghtening via machines today with improved posture, muscle recruitment and stamina without pain.  She is researching joining a gym and has improved confidence that she can simulate the exercises from PT with independence in both the home and gym setting.  PT will continue to monitor her closely with these exercises as the final touches are  made for Pt's HEP.    Rehab Potential  Good    PT Frequency  2x / week    PT Duration  8 weeks    PT Treatment/Interventions  ADLs/Self Care Home Management;Cryotherapy;Electrical Stimulation;Ultrasound;Moist Heat;Iontophoresis 4mg /ml Dexamethasone;Therapeutic activities;Therapeutic exercise;Neuromuscular re-education;Patient/family education;Manual techniques;Taping;Dry needling    PT Next Visit Plan  KX next visit; continue core and strengthening HEP independence to prepare for discharge next week     PT Home Exercise Plan   Access Code: [email or web address]        Patient will benefit from skilled therapeutic intervention in order to improve the following deficits and impairments:  Pain, Increased fascial restricitons, Postural dysfunction, Decreased activity tolerance, Decreased range of motion, Decreased strength, Hypomobility, Impaired perceived functional ability, Difficulty walking  Visit Diagnosis: Radiculopathy, lumbar region  Stiffness of left hip, not elsewhere classified  Muscle weakness (generalized)  Difficulty in walking, not elsewhere classified     Problem List There are no active problems to display for this patient. Lavinia Sharps, PT 05/22/18 11:35 AM Phone: [phone] Fax: [phone]  Vivien Presto 05/22/2018, 11:34 AM  [facility] [address], [address] [place], [place], [address] Phone: [phone]   Fax:  [phone]  Name: Sherran Margolis MRN: [id number] Date of Birth: 1951-09-10

## 2018-05-26 ENCOUNTER — Ambulatory Visit: Payer: Medicare Other

## 2018-05-26 DIAGNOSIS — M5416 Radiculopathy, lumbar region: Secondary | ICD-10-CM

## 2018-05-26 DIAGNOSIS — M25652 Stiffness of left hip, not elsewhere classified: Secondary | ICD-10-CM

## 2018-05-26 DIAGNOSIS — R262 Difficulty in walking, not elsewhere classified: Secondary | ICD-10-CM

## 2018-05-26 DIAGNOSIS — M6281 Muscle weakness (generalized): Secondary | ICD-10-CM

## 2018-05-26 NOTE — Therapy (Signed)
[facility] [address], [place], [place], [address] Phone: [phone]   Fax:  [phone]  Physical Therapy Treatment  Patient Details  Name: Brenda Mccall MRN: [id number] Date of Birth: [birth year] Referring Provider: Dr. Sherwood Gambler    Encounter Date: 05/26/2018  PT End of Session - 05/26/18 1312    Visit Number  15    Date for PT Re-Evaluation  05/29/18    Authorization Type  Medicare    PT Start Time  1230    PT Stop Time  1313    PT Time Calculation (min)  43 min    Activity Tolerance  Patient tolerated treatment well    Behavior During Therapy  [facility] for tasks assessed/performed       Past Medical History:  Diagnosis Date  . Kidney stones   . Renal disorder     History reviewed. No pertinent surgical history.  There were no vitals filed for this visit.  Subjective Assessment - 05/26/18 1243    Subjective  I felt really good after the weights and the leg press.  Pt reports 80% improvment in symptoms and will be ready for D/C next session.      Currently in Pain?  Yes    Pain Score  1     Pain Location  Hip    Pain Orientation  Left    Pain Descriptors / Indicators  Sore    Pain Onset  More than a month ago    Pain Frequency  Intermittent    Aggravating Factors   change of position, too much activity    Pain Relieving Factors  movement         [facility] PT Assessment - 05/26/18 0001      AROM   Left Hip External Rotation   35    Left Hip Internal Rotation   10      Strength   Right Hip Extension  4+/5    Left Hip Flexion  4/5    Left Hip Extension  4/5    Left Knee Flexion  4+/5    Left Knee Extension  5/5                   [facility] Adult PT Treatment/Exercise - 05/26/18 0001      Lumbar Exercises: Aerobic   UBE (Upper Arm Bike)  --    Nustep  Level 2x 10 minutes   PT present to monitor     Lumbar Exercises: Machines for Strengthening   Cybex Knee Flexion  20# bil 20x    Leg  Press  seat 6 60# bil; 40# 15x each     Other Lumbar Machine Exercise  15# rows 2x10, lat pull downs 15#       Lumbar Exercises: Prone   Other Prone Lumbar Exercises  bent knee hip extension x 10 reps, 5 seconds   alternating Rt/Lt              PT Short Term Goals - 05/05/18 1701      PT SHORT TERM GOAL #1   Title  The patient will demonstrate knowledge of initial HEP and self care strategies appropriate for current problem     Status  Achieved      PT SHORT TERM GOAL #2   Title  The patient will report a 30% reduction in back and hip pain with rising from the chair or bed    Status  Achieved  PT SHORT TERM GOAL #3   Title  Patient will have improved left hip internal rotation to 10 degrees and external rotation to 30 degrees for greater ease tying her shoe    Status  Achieved      PT SHORT TERM GOAL #4   Title  Improved hip flexion to 95 degrees and hip extension to 5 degrees for greater ease getting in/out of the car    Status  Achieved        PT Long Term Goals - 05/26/18 1239      PT LONG TERM GOAL #2   Title  The patient will report a 60% improvement in back and left hip/buttock pain with rising from the chair, bed or car     Baseline  80% improvement    Status  Achieved      PT LONG TERM GOAL #4   Title  Improved hip internal rotation ROM to 15 and external rotation to 40 degrees needed for tying shoes and getting down on the floor to play with grandkids    Baseline  10 degrees IR, 30 degrees ER    Status  Partially Met      PT LONG TERM GOAL #5   Title  Core, hip and knee strength grossly 4 to 4+/5 needed to ascend and descend steps reciprocally.      Status  Achieved            Plan - 05/26/18 1303    Clinical Impression Statement  Pt reports 80% overall improvement in symptoms since the start of care.  Pt with increased hip and core strength and reports improved functional endurance.  Pt reports intermittent Lt buttock pain upon standing and  otherwise there is no increased pain with walking. PT encouraged pt to join the [facility] for further strength gains after discharge.  PT provided verbal and tactile cues for technique with leg press and prone exercise.  PT will finalize HEP and perform final goal assessment.      PT Frequency  2x / week    PT Duration  8 weeks    PT Treatment/Interventions  ADLs/Self Care Home Management;Cryotherapy;Electrical Stimulation;Ultrasound;Moist Heat;Iontophoresis 86m/ml Dexamethasone;Therapeutic activities;Therapeutic exercise;Neuromuscular re-education;Patient/family education;Manual techniques;Taping;Dry needling    PT Next Visit Plan  KX, continue core and strengthening HEP independence to prepare for discharge next session    PT Home Exercise Plan   Access Code: [email or web address]    Consulted and Agree with Plan of Care  Patient       Patient will benefit from skilled therapeutic intervention in order to improve the following deficits and impairments:  Pain, Increased fascial restricitons, Postural dysfunction, Decreased activity tolerance, Decreased range of motion, Decreased strength, Hypomobility, Impaired perceived functional ability, Difficulty walking  Visit Diagnosis: Radiculopathy, lumbar region  Stiffness of left hip, not elsewhere classified  Muscle weakness (generalized)  Difficulty in walking, not elsewhere classified     Problem List There are no active problems to display for this patient.    KSigurd Sos PT 05/26/18 1:14 PM  [facility] [facility] [address] [place] [address]: [phone]  Fax:  [phone] Name: Brenda SchollerMRN: [id number] of Birth: [birth year]

## 2018-05-28 ENCOUNTER — Ambulatory Visit: Payer: Medicare Other

## 2018-05-28 DIAGNOSIS — M25652 Stiffness of left hip, not elsewhere classified: Secondary | ICD-10-CM

## 2018-05-28 DIAGNOSIS — M6281 Muscle weakness (generalized): Secondary | ICD-10-CM

## 2018-05-28 DIAGNOSIS — M5416 Radiculopathy, lumbar region: Secondary | ICD-10-CM

## 2018-05-28 NOTE — Therapy (Signed)
[facility] [address], [place], [place], [address] Phone: [phone]   Fax:  [phone]  Physical Therapy Treatment  Patient Details  Name: Brenda Mccall MRN: [id number] Date of Birth: Oct 30, 1950 Referring Provider: Dr. Sherwood Gambler    Encounter Date: 05/28/2018  PT End of Session - 05/28/18 1312    Visit Number  16    PT Start Time  3785    PT Stop Time  1311    PT Time Calculation (min)  38 min    Activity Tolerance  Patient tolerated treatment well    Behavior During Therapy  [facility] for tasks assessed/performed       Past Medical History:  Diagnosis Date  . Kidney stones   . Renal disorder     History reviewed. No pertinent surgical history.  There were no vitals filed for this visit.      [facility] PT Assessment - 05/28/18 0001      Assessment   Medical Diagnosis  lumbar radiculopathy    Onset Date/Surgical Date  --   > 6 months     Observation/Other Assessments   Focus on Therapeutic Outcomes (FOTO)   40% limitation      AROM   Left Hip External Rotation   35    Left Hip Internal Rotation   10      Strength   Right Hip Extension  4+/5    Left Hip Flexion  4/5    Left Hip Extension  4/5    Left Knee Flexion  4+/5    Left Knee Extension  5/5                   [facility] Adult PT Treatment/Exercise - 05/28/18 0001      Exercises   Exercises  Knee/Hip;Lumbar      Lumbar Exercises: Aerobic   Nustep  Level 2x 10 minutes   PT present to monitor     Lumbar Exercises: Machines for Strengthening   Cybex Knee Extension  15# 2x10    Cybex Knee Flexion  20# bil 20x    Leg Press  seat 6 60# bil; 40# 15x each     Other Lumbar Machine Exercise  15# rows 2x10, lat pull downs 15#                PT Short Term Goals - 05/05/18 1701      PT SHORT TERM GOAL #1   Title  The patient will demonstrate knowledge of initial HEP and self care strategies appropriate for current problem     Status  Achieved      PT SHORT TERM GOAL #2   Title  The patient will report a 30% reduction in back and hip pain with rising from the chair or bed    Status  Achieved      PT SHORT TERM GOAL #3   Title  Patient will have improved left hip internal rotation to 10 degrees and external rotation to 30 degrees for greater ease tying her shoe    Status  Achieved      PT SHORT TERM GOAL #4   Title  Improved hip flexion to 95 degrees and hip extension to 5 degrees for greater ease getting in/out of the car    Status  Achieved        PT Long Term Goals - 05/28/18 1237      PT LONG TERM GOAL #1   Title  The patient  will be independent with safe, self progression of HEP      Status  Achieved      PT LONG TERM GOAL #2   Title  The patient will report a 60% improvement in back and left hip/buttock pain with rising from the chair, bed or car     Baseline  80% improvement    Status  Achieved      PT LONG TERM GOAL #3   Title  The patient will be able to walk 500 feet with minimal pain     Baseline  pain with first few steps and then no pain    Status  Partially Met      PT LONG TERM GOAL #4   Title  Improved hip internal rotation ROM to 15 and external rotation to 40 degrees needed for tying shoes and getting down on the floor to play with grandkids    Baseline  10 degrees IR, 30 degrees ER    Status  Partially Met      PT LONG TERM GOAL #5   Title  Core, hip and knee strength grossly 4 to 4+/5 needed to ascend and descend steps reciprocally.      Status  Achieved      PT LONG TERM GOAL #6   Title  FOTO functional outcome score improved from 48% limitation to 43% indicating improved function with less pain     Status  Achieved            Plan - 05/28/18 1251    Clinical Impression Statement  Pt reports 80% overall improvement in symptoms since the start of care.  Pt with increased hip and core strength and reports improved functional endurance. Pt is able to get on the floor  due to improved hip flexibility and has difficulty getting up due to weakness. FOTO is improved to 40% limitation.  Pt reports intermittent Lt buttock pain upon standing and otherwise there is no increased pain with walking. PT encouraged pt to join the [facility] for further strength gains after discharge.  PT provided verbal and tactile cues for technique with leg press and other weights to reduce substitution and decrease speed.     PT Next Visit Plan  D/C PT to HEP    PT Home Exercise Plan   Access Code: [email or web address]     Consulted and Agree with Plan of Care  Patient       Patient will benefit from skilled therapeutic intervention in order to improve the following deficits and impairments:     Visit Diagnosis: Radiculopathy, lumbar region  Stiffness of left hip, not elsewhere classified  Muscle weakness (generalized)     Problem List There are no active problems to display for this patient.   PHYSICAL THERAPY DISCHARGE SUMMARY  Visits from Start of Care: 16  Current functional level related to goals / functional outcomes: See above for current status.  Pt will D/C to HEP.   Remaining deficits: LE weakness and hip stiffness- pt has HEP in place to address.   Education / Equipment: HEP Plan: Patient agrees to discharge.  Patient goals were partially met. Patient is being discharged due to being pleased with the current functional level.  ?????        Sigurd Sos, PT 05/28/18 1:16 PM  [facility] [address] [place], [place], [address] Phone: [phone]   Fax:  [phone]  Name: Brenda Mccall MRN: [id number] Date of Birth: 1950/11/25

## 2018-06-08 ENCOUNTER — Ambulatory Visit (INDEPENDENT_AMBULATORY_CARE_PROVIDER_SITE_OTHER): Payer: Medicare Other | Admitting: Orthopaedic Surgery

## 2018-06-08 ENCOUNTER — Ambulatory Visit (INDEPENDENT_AMBULATORY_CARE_PROVIDER_SITE_OTHER): Payer: Medicare Other

## 2018-06-08 ENCOUNTER — Encounter (INDEPENDENT_AMBULATORY_CARE_PROVIDER_SITE_OTHER): Payer: Self-pay | Admitting: Orthopaedic Surgery

## 2018-06-08 DIAGNOSIS — M1612 Unilateral primary osteoarthritis, left hip: Secondary | ICD-10-CM | POA: Diagnosis not present

## 2018-06-08 DIAGNOSIS — M25552 Pain in left hip: Secondary | ICD-10-CM

## 2018-06-08 NOTE — Progress Notes (Signed)
Office Visit Note   Patient: Brenda Mccall           Date of Birth: Aug 22, 1951           MRN: [id number] Visit Date: 06/08/2018              Requested by: No referring provider defined for this encounter. PCP: Patient, No Pcp Per   Assessment & Plan: Visit Diagnoses:  1. Pain in left hip   2. Unilateral primary osteoarthritis, left hip     Plan: Given the severity of her left hip pain and the detrimental effect this is had on her activities daily living, quality of life, and her mobility as well as the fact that her arthritis is so severe on plain films, she would like to proceed with total hip replacement surgery sometime after Thanksgiving.  I agree with this.  We a long thorough discussion about her x-rays in about what hip replacement surgery involves.  We talked about her intraoperative and postoperative course.  We discussed the risk and benefits of surgery in detail.  I gave her handout on surgery as well.  All question concerns were answered and addressed.  We will work on getting this scheduled for sometimes after Thanksgiving per her wishes.  Follow-Up Instructions: Return for 2 weeks post-op.   Orders:  Orders Placed This Encounter  Procedures  . XR HIP UNILAT W OR W/O PELVIS 1V LEFT   No orders of the defined types were placed in this encounter.     Procedures: No procedures performed   Clinical Data: No additional findings.   Subjective: Chief Complaint  Patient presents with  . Left Hip - Pain  Patient is a very pleasant 67 year old female with worsening hip pain for about 2 to 3 years now.  Is been getting significantly worse in her groin.  She had lumbar spine surgery in March but her hip pain as well as her her the worst.  She had x-rays in 2016 of her left hip after a fall down some stairs.  This did show some mild arthritic changes.  Her pain now is quite severe in the groin where it is detrimentally affected directives daily living, quality of life, and  her mobility.  Is gotten significantly worse.  She is walking with a limp.  She is tried and failed all forms conservative treatment including anti-inflammatories, activity modification, weight loss and physical therapy.  HPI  Review of Systems She currently denies any headache, chest pain, shortness of breath, fever, chills, nausea, vomiting.  Objective: Vital Signs: There were no vitals taken for this visit.  Physical Exam She is alert and oriented x3 and in no acute distress Ortho Exam Examination of her right hip shows full and fluid range of motion without difficulty examination of her left painful hip shows essentially almost no internal extra rotation with severe pain in the groin. Specialty Comments:  No specialty comments available.  Imaging: Xr Hip Unilat W Or W/o Pelvis 1v Left  Result Date: 06/08/2018 An AP pelvis and lateral left hip show severe end-stage arthritis of the left hip.  There is complete loss of joint space.  There is cystic changes in the femoral head and acetabulum with flattening of the femoral head.  There are periarticular osteophytes as well.  This is significantly worsened from films compared to 3 years ago in 2016.    PMFS History: Patient Active Problem List   Diagnosis Date Noted  . Unilateral primary osteoarthritis, left  hip 06/08/2018   Past Medical History:  Diagnosis Date  . Kidney stones   . Renal disorder     History reviewed. No pertinent family history.  History reviewed. No pertinent surgical history. Social History   Occupational History  . Not on file  Tobacco Use  . Smoking status: Never Smoker  Substance and Sexual Activity  . Alcohol use: Not on file  . Drug use: Not on file  . Sexual activity: Not on file

## 2018-07-06 ENCOUNTER — Ambulatory Visit (INDEPENDENT_AMBULATORY_CARE_PROVIDER_SITE_OTHER): Payer: Medicare Other | Admitting: Orthopaedic Surgery

## 2018-07-06 ENCOUNTER — Encounter (INDEPENDENT_AMBULATORY_CARE_PROVIDER_SITE_OTHER): Payer: Self-pay | Admitting: Orthopaedic Surgery

## 2018-07-06 DIAGNOSIS — M1612 Unilateral primary osteoarthritis, left hip: Secondary | ICD-10-CM

## 2018-07-06 NOTE — Progress Notes (Signed)
The patient someone actually saw about a month ago.  She has severe debilitating arthritis in her left hip.  Her x-rays show complete loss of joint space.  There are sclerotic and cystic change in the femoral head para-articular osteophytes.  On exam her hip is incredibly stiff with internal/external rotation with severe pain in the groin.  At that visit we went over hip model explained in detail with hip replacement surgery involves.  She came today wanting [place] to go over the surgery again.  I did go over in detail with her showing her a hip model.  We a long thorough discussion about her intraoperative and postoperative course.  We a long and thorough discussion about the risk and benefits of the surgery.  All questions concerns were answered and addressed.  I agree with going ahead and set the surgery up.  She has had this pain for over a year and at this point an intra-articular injection would not even be worthwhile because herto get into at this point.  She is tried activity modification and weight loss as well as been on anti-inflammatories.  We will work on getting the surgery scheduled.  My last office note can also be referred to as a relates to her entire exam and the rationale and reasoning behind a left total hip arthroplasty.  We then see her back in 2 weeks postoperative.

## 2018-07-23 ENCOUNTER — Other Ambulatory Visit (INDEPENDENT_AMBULATORY_CARE_PROVIDER_SITE_OTHER): Payer: Self-pay | Admitting: Physician Assistant

## 2018-07-28 NOTE — Patient Instructions (Addendum)
Brenda Mccall  07/28/2018   Your procedure is scheduled on: 08-07-18    Report to [facility] Main  Entr[facility]    Report to Admitting at 7:15 AM    Call this number if you have problems the morning of surgery [phone]    Remember: Do not eat food or drink liquids :After Midnight.    BRUSH YOUR TEETH MORNING OF SURGERY AND RINSE YOUR MOUTH OUT, NO CHEWING GUM CANDY OR MINTS.     Take these medicines the morning of surgery with A SIP OF WATER: None                                 You may not have any metal on your body including hair pins and              piercings  Do not wear jewelry, make-up, lotions, powders or perfumes, deodorant             Do not wear nail polish.  Do not shave  48 hours prior to surgery.    Do not bring valuables to the hospital. [facility] IS NOT             RESPONSIBLE   FOR VALUABLES.  Contacts, dentures or bridgework may not be worn into surgery.  Leave suitcase in the car. After surgery it may be brought to your room.    Special Instructions: N/A              Please read over the following fact sheets you were given: _____________________________________________________________________             [facility] - Preparing for [facility] Before surgery, you can play an important role.  Because skin is not sterile, your skin needs to be as free of germs as possible.  You can reduce the number of germs on your skin by washing with CHG (chlorahexidine gluconate) soap before surgery.  CHG is an antiseptic cleaner which kills germs and bonds with the skin to continue killing germs even after washing. Please DO NOT use if you have an allergy to CHG or antibacterial soaps.  If your skin becomes reddened/irritated stop using the CHG and inform your nurse when you arrive at Short Stay. Do not shave (including legs and underarms) for at least 48 hours prior to the first CHG shower.  You may shave your face/neck. Please follow these  instructions carefully:  1.  Shower with CHG Soap the night before surgery and the  morning of Surgery.  2.  If you choose to wash your hair, wash your hair first as usual with your  normal  shampoo.  3.  After you shampoo, rinse your hair and body thoroughly to remove the  shampoo.                           4.  Use CHG as you would any other liquid soap.  You can apply chg directly  to the skin and wash                       Gently with a scrungie or clean washcloth.  5.  Apply the CHG Soap to your body ONLY FROM THE NECK DOWN.   Do not use on face/ open  Wound or open sores. Avoid contact with eyes, ears mouth and genitals (private parts).                       Wash face,  Genitals (private parts) with your normal soap.             6.  Wash thoroughly, paying special attention to the area where your surgery  will be performed.  7.  Thoroughly rinse your body with warm water from the neck down.  8.  DO NOT shower/wash with your normal soap after using and rinsing off  the CHG Soap.                9.  Pat yourself dry with a clean towel.            10.  Wear clean pajamas.            11.  Place clean sheets on your bed the night of your first shower and do not  sleep with pets. Day of Surgery : Do not apply any lotions/deodorants the morning of surgery.  Please wear clean clothes to the hospital/surgery center.  FAILURE TO FOLLOW THESE INSTRUCTIONS MAY RESULT IN THE CANCELLATION OF YOUR SURGERY PATIENT SIGNATURE_________________________________  NURSE SIGNATURE__________________________________  ________________________________________________________________________   Adam Phenix  An incentive spirometer is a tool that can help keep your lungs clear and active. This tool measures how well you are filling your lungs with each breath. Taking long deep breaths may help reverse or decrease the chance of developing breathing (pulmonary) problems (especially  infection) following:  A long period of time when you are unable to move or be active. BEFORE THE PROCEDURE   If the spirometer includes an indicator to show your best effort, your nurse or respiratory therapist will set it to a desired goal.  If possible, sit up straight or lean slightly forward. Try not to slouch.  Hold the incentive spirometer in an upright position. INSTRUCTIONS FOR USE  1. Sit on the edge of your bed if possible, or sit up as far as you can in bed or on a chair. 2. Hold the incentive spirometer in an upright position. 3. Breathe out normally. 4. Place the mouthpiece in your mouth and seal your lips tightly around it. 5. Breathe in slowly and as deeply as possible, raising the piston or the ball toward the top of the column. 6. Hold your breath for 3-5 seconds or for as long as possible. Allow the piston or ball to fall to the bottom of the column. 7. Remove the mouthpiece from your mouth and breathe out normally. 8. Rest for a few seconds and repeat Steps 1 through 7 at least 10 times every 1-2 hours when you are awake. Take your time and take a few normal breaths between deep breaths. 9. The spirometer may include an indicator to show your best effort. Use the indicator as a goal to work toward during each repetition. 10. After each set of 10 deep breaths, practice coughing to be sure your lungs are clear. If you have an incision (the cut made at the time of surgery), support your incision when coughing by placing a pillow or rolled up towels firmly against it. Once you are able to get out of bed, walk around indoors and cough well. You may stop using the incentive spirometer when instructed by your caregiver.  RISKS AND COMPLICATIONS  Take your time so you do not get  dizzy or light-headed.  If you are in pain, you may need to take or ask for pain medication before doing incentive spirometry. It is harder to take a deep breath if you are having pain. AFTER  USE  Rest and breathe slowly and easily.  It can be helpful to keep track of a log of your progress. Your caregiver can provide you with a simple table to help with this. If you are using the spirometer at home, follow these instructions: [place] IF:   You are having difficultly using the spirometer.  You have trouble using the spirometer as often as instructed.  Your pain medication is not giving enough relief while using the spirometer.  You develop fever of 100.5 F (38.1 C) or higher. SEEK IMMEDIATE MEDICAL CARE IF:   You cough up bloody sputum that had not been present before.  You develop fever of 102 F (38.9 C) or greater.  You develop worsening pain at or near the incision site. MAKE SURE YOU:   Understand these instructions.  Will watch your condition.  Will get help right away if you are not doing well or get worse. Document Released: 01/13/2007 Document Revised: 11/25/2011 Document Reviewed: 03/16/2007 [facility] Patient Information 2014 [place], [place].   ________________________________________________________________________

## 2018-07-30 ENCOUNTER — Other Ambulatory Visit: Payer: Self-pay

## 2018-07-30 ENCOUNTER — Encounter (HOSPITAL_COMMUNITY)
Admission: RE | Admit: 2018-07-30 | Discharge: 2018-07-30 | Disposition: A | Discharge status: Home / Self Care | Payer: Medicare Other | Source: Ambulatory Visit | Attending: Orthopaedic Surgery | Admitting: Orthopaedic Surgery

## 2018-07-30 ENCOUNTER — Encounter (HOSPITAL_COMMUNITY): Payer: Self-pay

## 2018-07-30 DIAGNOSIS — Z01812 Encounter for preprocedural laboratory examination: Secondary | ICD-10-CM | POA: Diagnosis not present

## 2018-07-30 DIAGNOSIS — M1612 Unilateral primary osteoarthritis, left hip: Secondary | ICD-10-CM | POA: Diagnosis not present

## 2018-07-30 HISTORY — DX: Personal history of urinary calculi: Z87.442

## 2018-07-30 HISTORY — DX: Unspecified osteoarthritis, unspecified site: M19.90

## 2018-07-30 LAB — SURGICAL PCR SCREEN
MRSA, PCR: NEGATIVE
Staphylococcus aureus: NEGATIVE

## 2018-07-30 NOTE — Progress Notes (Signed)
07-19-18 CBC w/Diff, CMP, and HGA1C on chart

## 2018-08-06 NOTE — Anesthesia Preprocedure Evaluation (Addendum)
Anesthesia Evaluation  Patient identified by MRN, date of birth, ID band Patient awake    Reviewed: Allergy & Precautions, NPO status , Patient's Chart, lab work & pertinent test results  History of Anesthesia Complications Negative for: history of anesthetic complications  Airway Mallampati: II  TM Distance: >3 FB Neck ROM: Full    Dental no notable dental hx. (+) Teeth Intact   Pulmonary neg pulmonary ROS,    Pulmonary exam normal breath sounds clear to auscultation       Cardiovascular negative cardio ROS Normal cardiovascular exam Rhythm:Regular Rate:Normal     Neuro/Psych negative neurological ROS     GI/Hepatic negative GI ROS, Neg liver ROS,   Endo/Other  Obese, BMI 31.5  Renal/GU negative Renal ROS     Musculoskeletal  (+) Arthritis , Osteoarthritis,    Abdominal   Peds  Hematology negative hematology ROS (+)   Anesthesia Other Findings Day of surgery medications reviewed with the patient.  Reproductive/Obstetrics                            Anesthesia Physical Anesthesia Plan  ASA: II  Anesthesia Plan: Spinal   Post-op Pain Management:    Induction: Intravenous  PONV Risk Score and Plan: 2 and Treatment may vary due to age or medical condition, Ondansetron, Dexamethasone and Propofol infusion  Airway Management Planned: Nasal Cannula and Natural Airway  Additional Equipment:   Intra-op Plan:   Post-operative Plan:   Informed Consent: I have reviewed the patients History and Physical, chart, labs and discussed the procedure including the risks, benefits and alternatives for the proposed anesthesia with the patient or authorized representative who has indicated his/her understanding and acceptance.   Dental advisory given  Plan Discussed with: CRNA  Anesthesia Plan Comments:        Anesthesia Quick Evaluation

## 2018-08-07 ENCOUNTER — Inpatient Hospital Stay (HOSPITAL_COMMUNITY): Payer: Medicare Other | Admitting: Anesthesiology

## 2018-08-07 ENCOUNTER — Encounter (HOSPITAL_COMMUNITY): Payer: Self-pay | Admitting: *Deleted

## 2018-08-07 ENCOUNTER — Inpatient Hospital Stay (HOSPITAL_COMMUNITY): Payer: Medicare Other

## 2018-08-07 ENCOUNTER — Inpatient Hospital Stay (HOSPITAL_COMMUNITY)
Admission: RE | Admit: 2018-08-07 | Discharge: 2018-08-09 | DRG: 470 | Disposition: A | Discharge status: Home Health | Payer: Medicare Other | Attending: Orthopaedic Surgery | Admitting: Orthopaedic Surgery

## 2018-08-07 ENCOUNTER — Other Ambulatory Visit: Payer: Self-pay

## 2018-08-07 ENCOUNTER — Encounter (HOSPITAL_COMMUNITY)
Admission: RE | Disposition: A | Discharge status: Home Health | Payer: Self-pay | Source: Home / Self Care | Attending: Orthopaedic Surgery

## 2018-08-07 DIAGNOSIS — M1612 Unilateral primary osteoarthritis, left hip: Secondary | ICD-10-CM | POA: Diagnosis present

## 2018-08-07 DIAGNOSIS — Z419 Encounter for procedure for purposes other than remedying health state, unspecified: Secondary | ICD-10-CM

## 2018-08-07 DIAGNOSIS — Z87442 Personal history of urinary calculi: Secondary | ICD-10-CM | POA: Diagnosis not present

## 2018-08-07 DIAGNOSIS — E669 Obesity, unspecified: Secondary | ICD-10-CM | POA: Diagnosis present

## 2018-08-07 DIAGNOSIS — Z79899 Other long term (current) drug therapy: Secondary | ICD-10-CM | POA: Diagnosis not present

## 2018-08-07 DIAGNOSIS — Z888 Allergy status to other drugs, medicaments and biological substances status: Secondary | ICD-10-CM | POA: Diagnosis not present

## 2018-08-07 DIAGNOSIS — Z6831 Body mass index (BMI) 31.0-31.9, adult: Secondary | ICD-10-CM | POA: Diagnosis not present

## 2018-08-07 DIAGNOSIS — N289 Disorder of kidney and ureter, unspecified: Secondary | ICD-10-CM | POA: Diagnosis present

## 2018-08-07 DIAGNOSIS — Z96642 Presence of left artificial hip joint: Secondary | ICD-10-CM

## 2018-08-07 DIAGNOSIS — M25552 Pain in left hip: Secondary | ICD-10-CM | POA: Diagnosis present

## 2018-08-07 HISTORY — PX: TOTAL HIP ARTHROPLASTY: SHX124

## 2018-08-07 SURGERY — ARTHROPLASTY, HIP, TOTAL, ANTERIOR APPROACH
Anesthesia: Spinal | Site: Hip | Laterality: Left

## 2018-08-07 MED ORDER — SODIUM CHLORIDE 0.9 % IV SOLN
INTRAVENOUS | Status: DC
  Administered 2018-08-07 – 2018-08-08 (×2): via INTRAVENOUS

## 2018-08-07 MED ORDER — ASPIRIN 81 MG PO CHEW
81.0000 mg | CHEWABLE_TABLET | Freq: Two times a day (BID) | ORAL | Status: DC
  Administered 2018-08-07 – 2018-08-09 (×4): 81 mg via ORAL
  Filled 2018-08-07 (×4): qty 1

## 2018-08-07 MED ORDER — FENTANYL CITRATE (PF) 100 MCG/2ML IJ SOLN
INTRAMUSCULAR | Status: AC
  Filled 2018-08-07: qty 2

## 2018-08-07 MED ORDER — CHLORHEXIDINE GLUCONATE 4 % EX LIQD: 60.0000 mL | Freq: Once | CUTANEOUS | Status: DC

## 2018-08-07 MED ORDER — MENTHOL 3 MG MT LOZG: 1.0000 | LOZENGE | OROMUCOSAL | Status: DC | PRN

## 2018-08-07 MED ORDER — ADULT MULTIVITAMIN W/MINERALS CH
1.0000 | ORAL_TABLET | Freq: Every day | ORAL | Status: DC
  Filled 2018-08-07: qty 1

## 2018-08-07 MED ORDER — HYDROMORPHONE HCL 1 MG/ML IJ SOLN
0.5000 mg | INTRAMUSCULAR | Status: DC | PRN
  Administered 2018-08-07 – 2018-08-08 (×3): 1 mg via INTRAVENOUS
  Filled 2018-08-07 (×3): qty 1

## 2018-08-07 MED ORDER — LIP MEDEX EX OINT
TOPICAL_OINTMENT | CUTANEOUS | Status: AC
  Administered 2018-08-07: 22:00:00
  Filled 2018-08-07: qty 7

## 2018-08-07 MED ORDER — PHENYLEPHRINE 40 MCG/ML (10ML) SYRINGE FOR IV PUSH (FOR BLOOD PRESSURE SUPPORT)
PREFILLED_SYRINGE | INTRAVENOUS | Status: DC | PRN
  Administered 2018-08-07: 40 ug via INTRAVENOUS
  Administered 2018-08-07 (×2): 80 ug via INTRAVENOUS
  Administered 2018-08-07: 40 ug via INTRAVENOUS
  Administered 2018-08-07: 80 ug via INTRAVENOUS
  Administered 2018-08-07: 40 ug via INTRAVENOUS

## 2018-08-07 MED ORDER — ACETAMINOPHEN 10 MG/ML IV SOLN: 1000.0000 mg | Freq: Once | INTRAVENOUS | Status: DC | PRN

## 2018-08-07 MED ORDER — ONDANSETRON HCL 4 MG PO TABS: 4.0000 mg | ORAL_TABLET | Freq: Four times a day (QID) | ORAL | Status: DC | PRN

## 2018-08-07 MED ORDER — LORATADINE 10 MG PO TABS
10.0000 mg | ORAL_TABLET | Freq: Every day | ORAL | Status: DC
  Administered 2018-08-08: 10 mg via ORAL
  Filled 2018-08-07: qty 1

## 2018-08-07 MED ORDER — POLYETHYLENE GLYCOL 3350 17 G PO PACK: 17.0000 g | PACK | Freq: Every day | ORAL | Status: DC | PRN

## 2018-08-07 MED ORDER — PROPOFOL 10 MG/ML IV BOLUS
INTRAVENOUS | Status: DC | PRN
  Administered 2018-08-07 (×3): 10 mg via INTRAVENOUS

## 2018-08-07 MED ORDER — VITAMIN D 25 MCG (1000 UNIT) PO TABS
5000.0000 [IU] | ORAL_TABLET | Freq: Every day | ORAL | Status: DC
  Filled 2018-08-07: qty 5

## 2018-08-07 MED ORDER — OXYCODONE HCL 5 MG/5ML PO SOLN
5.0000 mg | Freq: Once | ORAL | Status: DC | PRN
  Filled 2018-08-07: qty 5

## 2018-08-07 MED ORDER — METOCLOPRAMIDE HCL 5 MG PO TABS: 5.0000 mg | ORAL_TABLET | Freq: Three times a day (TID) | ORAL | Status: DC | PRN

## 2018-08-07 MED ORDER — MIDAZOLAM HCL 2 MG/2ML IJ SOLN
INTRAMUSCULAR | Status: AC
  Filled 2018-08-07: qty 2

## 2018-08-07 MED ORDER — ACETAMINOPHEN 325 MG PO TABS
325.0000 mg | ORAL_TABLET | Freq: Four times a day (QID) | ORAL | Status: DC | PRN
  Administered 2018-08-08 (×3): 650 mg via ORAL
  Filled 2018-08-07 (×3): qty 2

## 2018-08-07 MED ORDER — B COMPLEX PO TABS: 1.0000 | ORAL_TABLET | Freq: Every day | ORAL | Status: DC

## 2018-08-07 MED ORDER — PROMETHAZINE HCL 25 MG/ML IJ SOLN: 6.2500 mg | INTRAMUSCULAR | Status: DC | PRN

## 2018-08-07 MED ORDER — PANTOPRAZOLE SODIUM 40 MG PO TBEC
40.0000 mg | DELAYED_RELEASE_TABLET | Freq: Every day | ORAL | Status: DC
  Administered 2018-08-07 – 2018-08-08 (×2): 40 mg via ORAL
  Filled 2018-08-07 (×2): qty 1

## 2018-08-07 MED ORDER — PROPOFOL 500 MG/50ML IV EMUL
INTRAVENOUS | Status: DC | PRN
  Administered 2018-08-07: 75 ug/kg/min via INTRAVENOUS

## 2018-08-07 MED ORDER — SERTRALINE HCL 50 MG PO TABS
50.0000 mg | ORAL_TABLET | Freq: Every day | ORAL | Status: DC
  Administered 2018-08-08: 50 mg via ORAL
  Filled 2018-08-07: qty 1

## 2018-08-07 MED ORDER — FLUTICASONE PROPIONATE 50 MCG/ACT NA SUSP
2.0000 | Freq: Every evening | NASAL | Status: DC | PRN
  Filled 2018-08-07: qty 16

## 2018-08-07 MED ORDER — LACTATED RINGERS IV SOLN
INTRAVENOUS | Status: DC
  Administered 2018-08-07 (×2): via INTRAVENOUS
  Administered 2018-08-07: 1000 mL via INTRAVENOUS

## 2018-08-07 MED ORDER — BUPIVACAINE IN DEXTROSE 0.75-8.25 % IT SOLN
INTRATHECAL | Status: DC | PRN
  Administered 2018-08-07: 1.5 mL via INTRATHECAL

## 2018-08-07 MED ORDER — STERILE WATER FOR IRRIGATION IR SOLN
Status: DC | PRN
  Administered 2018-08-07: 2000 mL

## 2018-08-07 MED ORDER — CEFAZOLIN SODIUM-DEXTROSE 1-4 GM/50ML-% IV SOLN
1.0000 g | Freq: Four times a day (QID) | INTRAVENOUS | Status: AC
  Administered 2018-08-07 (×2): 1 g via INTRAVENOUS
  Filled 2018-08-07 (×2): qty 50

## 2018-08-07 MED ORDER — OXYCODONE HCL 5 MG PO TABS: 5.0000 mg | ORAL_TABLET | Freq: Once | ORAL | Status: DC | PRN

## 2018-08-07 MED ORDER — SODIUM CHLORIDE 0.9 % IR SOLN
Status: DC | PRN
  Administered 2018-08-07: 1000 mL

## 2018-08-07 MED ORDER — DOCUSATE SODIUM 100 MG PO CAPS
100.0000 mg | ORAL_CAPSULE | Freq: Two times a day (BID) | ORAL | Status: DC
  Administered 2018-08-07 – 2018-08-08 (×3): 100 mg via ORAL
  Filled 2018-08-07 (×3): qty 1

## 2018-08-07 MED ORDER — METHOCARBAMOL 500 MG IVPB - SIMPLE MED
500.0000 mg | Freq: Four times a day (QID) | INTRAVENOUS | Status: DC | PRN
  Filled 2018-08-07: qty 50

## 2018-08-07 MED ORDER — MIDAZOLAM HCL 5 MG/5ML IJ SOLN
INTRAMUSCULAR | Status: DC | PRN
  Administered 2018-08-07: 2 mg via INTRAVENOUS

## 2018-08-07 MED ORDER — PHENOL 1.4 % MT LIQD
1.0000 | OROMUCOSAL | Status: DC | PRN
  Filled 2018-08-07: qty 177

## 2018-08-07 MED ORDER — B COMPLEX-C PO TABS
1.0000 | ORAL_TABLET | Freq: Every day | ORAL | Status: DC
  Filled 2018-08-07 (×2): qty 1

## 2018-08-07 MED ORDER — FENTANYL CITRATE (PF) 100 MCG/2ML IJ SOLN
INTRAMUSCULAR | Status: DC | PRN
  Administered 2018-08-07 (×2): 25 ug via INTRAVENOUS
  Administered 2018-08-07: 50 ug via INTRAVENOUS

## 2018-08-07 MED ORDER — TRANEXAMIC ACID-NACL 1000-0.7 MG/100ML-% IV SOLN
1000.0000 mg | INTRAVENOUS | Status: AC
  Administered 2018-08-07: 1000 mg via INTRAVENOUS
  Filled 2018-08-07: qty 100

## 2018-08-07 MED ORDER — METHOCARBAMOL 500 MG PO TABS
500.0000 mg | ORAL_TABLET | Freq: Four times a day (QID) | ORAL | Status: DC | PRN
  Administered 2018-08-07 – 2018-08-09 (×5): 500 mg via ORAL
  Filled 2018-08-07 (×4): qty 1

## 2018-08-07 MED ORDER — OXYCODONE HCL 5 MG PO TABS
ORAL_TABLET | ORAL | Status: AC
  Filled 2018-08-07: qty 2

## 2018-08-07 MED ORDER — ALUM & MAG HYDROXIDE-SIMETH 200-200-20 MG/5ML PO SUSP: 30.0000 mL | ORAL | Status: DC | PRN

## 2018-08-07 MED ORDER — OXYCODONE HCL 5 MG PO TABS
10.0000 mg | ORAL_TABLET | ORAL | Status: DC | PRN
  Administered 2018-08-07 – 2018-08-08 (×2): 10 mg via ORAL
  Administered 2018-08-08: 15 mg via ORAL
  Filled 2018-08-07: qty 2
  Filled 2018-08-07 (×2): qty 3

## 2018-08-07 MED ORDER — DEXAMETHASONE SODIUM PHOSPHATE 10 MG/ML IJ SOLN
INTRAMUSCULAR | Status: DC | PRN
  Administered 2018-08-07: 10 mg via INTRAVENOUS

## 2018-08-07 MED ORDER — ONDANSETRON HCL 4 MG/2ML IJ SOLN: 4.0000 mg | Freq: Four times a day (QID) | INTRAMUSCULAR | Status: DC | PRN

## 2018-08-07 MED ORDER — CEFAZOLIN SODIUM-DEXTROSE 2-4 GM/100ML-% IV SOLN
2.0000 g | INTRAVENOUS | Status: AC
  Administered 2018-08-07: 2 g via INTRAVENOUS
  Filled 2018-08-07: qty 100

## 2018-08-07 MED ORDER — FENTANYL CITRATE (PF) 100 MCG/2ML IJ SOLN: 25.0000 ug | INTRAMUSCULAR | Status: DC | PRN

## 2018-08-07 MED ORDER — OXYCODONE HCL 5 MG PO TABS
5.0000 mg | ORAL_TABLET | ORAL | Status: DC | PRN
  Administered 2018-08-07 – 2018-08-08 (×4): 10 mg via ORAL
  Administered 2018-08-09: 5 mg via ORAL
  Filled 2018-08-07: qty 1
  Filled 2018-08-07 (×3): qty 2

## 2018-08-07 MED ORDER — METOCLOPRAMIDE HCL 5 MG/ML IJ SOLN: 5.0000 mg | Freq: Three times a day (TID) | INTRAMUSCULAR | Status: DC | PRN

## 2018-08-07 MED ORDER — DIPHENHYDRAMINE HCL 12.5 MG/5ML PO ELIX: 12.5000 mg | ORAL_SOLUTION | ORAL | Status: DC | PRN

## 2018-08-07 MED ORDER — BIOTIN 1000 MCG PO TABS: 1000.0000 ug | ORAL_TABLET | Freq: Every day | ORAL | Status: DC

## 2018-08-07 MED ORDER — ONDANSETRON HCL 4 MG/2ML IJ SOLN
INTRAMUSCULAR | Status: DC | PRN
  Administered 2018-08-07: 4 mg via INTRAVENOUS

## 2018-08-07 MED ORDER — VITAMIN D3 125 MCG (5000 UT) PO CAPS: 5000.0000 [IU] | ORAL_CAPSULE | Freq: Every day | ORAL | Status: DC

## 2018-08-07 SURGICAL SUPPLY — 41 items
BAG ZIPLOCK 12X15 (MISCELLANEOUS) ×2 IMPLANT
BENZOIN TINCTURE PRP APPL 2/3 (GAUZE/BANDAGES/DRESSINGS) ×2 IMPLANT
BLADE SAW SGTL 18X1.27X75 (BLADE) ×2 IMPLANT
COVER PERINEAL POST (MISCELLANEOUS) ×2 IMPLANT
COVER SURGICAL LIGHT HANDLE (MISCELLANEOUS) ×2 IMPLANT
COVER WAND RF STERILE (DRAPES) ×2 IMPLANT
CUP SECTOR GRIPTON 50MM (Cup) ×2 IMPLANT
DRAPE STERI IOBAN 125X83 (DRAPES) ×2 IMPLANT
DRAPE U-SHAPE 47X51 STRL (DRAPES) ×4 IMPLANT
DRESSING AQUACEL AG SP 3.5X10 (GAUZE/BANDAGES/DRESSINGS) ×1 IMPLANT
DRSG AQUACEL AG SP 3.5X10 (GAUZE/BANDAGES/DRESSINGS) ×2
DURAPREP 26ML APPLICATOR (WOUND CARE) ×2 IMPLANT
ELECT REM PT RETURN 15FT ADLT (MISCELLANEOUS) ×2 IMPLANT
GAUZE XEROFORM 1X8 LF (GAUZE/BANDAGES/DRESSINGS) ×2 IMPLANT
GLOVE BIO SURGEON STRL SZ7.5 (GLOVE) ×2 IMPLANT
GLOVE BIOGEL PI IND STRL 7.5 (GLOVE) ×3 IMPLANT
GLOVE BIOGEL PI IND STRL 8 (GLOVE) ×3 IMPLANT
GLOVE BIOGEL PI INDICATOR 7.5 (GLOVE) ×3
GLOVE BIOGEL PI INDICATOR 8 (GLOVE) ×3
GLOVE ECLIPSE 8.0 STRL XLNG CF (GLOVE) ×2 IMPLANT
GLOVE SURG SS PI 7.0 STRL IVOR (GLOVE) ×4 IMPLANT
GLOVE SURG SS PI 8.0 STRL IVOR (GLOVE) ×4 IMPLANT
GOWN STRL REUS W/TWL LRG LVL3 (GOWN DISPOSABLE) ×2 IMPLANT
GOWN STRL REUS W/TWL XL LVL3 (GOWN DISPOSABLE) ×8 IMPLANT
HANDPIECE INTERPULSE COAX TIP (DISPOSABLE) ×1
HEAD FEM STD 32X+1 STRL (Hips) ×2 IMPLANT
HOLDER FOLEY CATH W/STRAP (MISCELLANEOUS) ×2 IMPLANT
LINER ACETABULAR 32X50 (Liner) ×2 IMPLANT
PACK ANTERIOR HIP CUSTOM (KITS) ×2 IMPLANT
SET HNDPC FAN SPRY TIP SCT (DISPOSABLE) ×1 IMPLANT
STAPLER VISISTAT 35W (STAPLE) ×2 IMPLANT
STEM CORAIL KA11 (Stem) ×2 IMPLANT
STRIP CLOSURE SKIN 1/2X4 (GAUZE/BANDAGES/DRESSINGS) ×2 IMPLANT
SUT ETHIBOND NAB CT1 #1 30IN (SUTURE) ×2 IMPLANT
SUT MNCRL AB 4-0 PS2 18 (SUTURE) ×2 IMPLANT
SUT VIC AB 0 CT1 36 (SUTURE) ×2 IMPLANT
SUT VIC AB 1 CT1 36 (SUTURE) ×2 IMPLANT
SUT VIC AB 2-0 CT1 27 (SUTURE) ×2
SUT VIC AB 2-0 CT1 TAPERPNT 27 (SUTURE) ×2 IMPLANT
TRAY FOLEY MTR SLVR 14FR STAT (SET/KITS/TRAYS/PACK) ×2 IMPLANT
YANKAUER SUCT BULB TIP 10FT TU (MISCELLANEOUS) ×2 IMPLANT

## 2018-08-07 NOTE — H&P (Signed)
TOTAL HIP ADMISSION H&P  Patient is admitted for left total hip arthroplasty.  Subjective:  Chief Complaint: left hip pain  HPI: Brenda Mccall, 67 y.o. female, has a history of pain and functional disability in the left hip(s) due to arthritis and patient has failed non-surgical conservative treatments for greater than 12 weeks to include NSAID's and/or analgesics, corticosteriod injections, flexibility and strengthening excercises, weight reduction as appropriate and activity modification.  Onset of symptoms was gradual starting 3 years ago with gradually worsening course since that time.The patient noted no past surgery on the left hip(s).  Patient currently rates pain in the left hip at 10 out of 10 with activity. Patient has night pain, worsening of pain with activity and weight bearing, trendelenberg gait, pain that interfers with activities of daily living and pain with passive range of motion. Patient has evidence of subchondral sclerosis, periarticular osteophytes and joint space narrowing by imaging studies. This condition presents safety issues increasing the risk of falls.  There is no current active infection.  Patient Active Problem List   Diagnosis Date Noted  . Unilateral primary osteoarthritis, left hip 06/08/2018   Past Medical History:  Diagnosis Date  . Arthritis   . History of kidney stones   . Kidney stones   . Renal disorder     Past Surgical History:  Procedure Laterality Date  . APPENDECTOMY    . BACK SURGERY  11/2017   Discectomy  . BELPHAROPTOSIS REPAIR    . CARPAL TUNNEL RELEASE Bilateral 2000  . CESAREAN SECTION  1980  . CHOLECYSTECTOMY  1981  . COLONOSCOPY  2014  . EYE SURGERY Bilateral    Cataract  . KIDNEY STONE SURGERY    . NISSEN FUNDOPLICATION  2004    No current facility-administered medications for this encounter.    Current Outpatient Medications  Medication Sig Dispense Refill Last Dose  . b complex vitamins tablet Take 1 tablet by mouth  daily.     . Biotin 1000 MCG tablet Take 1,000 mcg by mouth daily.     . cetirizine (ZYRTEC) 10 MG tablet Take 10 mg by mouth at bedtime.    Taking  . Cholecalciferol (VITAMIN D3) 125 MCG (5000 UT) CAPS Take 5,000 Units by mouth daily.     Marland Kitchen. LORazepam (ATIVAN) 0.5 MG tablet Take 0.5 mg by mouth at bedtime.   Taking  . meloxicam (MOBIC) 15 MG tablet Take 15 mg by mouth daily.    Taking  . Multiple Vitamin (MULTIVITAMIN WITH MINERALS) TABS tablet Take 1 tablet by mouth daily.     . sertraline (ZOLOFT) 50 MG tablet Take 50 mg by mouth daily.    Taking  . tiZANidine (ZANAFLEX) 4 MG tablet Take 2 mg by mouth at bedtime as needed for muscle spasms.     . traMADol (ULTRAM) 50 MG tablet Take 50 mg by mouth every 6 (six) hours as needed for severe pain.     . cyclobenzaprine (FLEXERIL) 10 MG tablet Take 1 tablet (10 mg total) by mouth 2 (two) times daily as needed for muscle spasms. (Patient not taking: Reported on 07/27/2018) 20 tablet 0 Not Taking at Unknown time  . fluticasone (FLONASE) 50 MCG/ACT nasal spray Place 2 sprays into both nostrils at bedtime as needed (congestion).     Marland Kitchen. oxyCODONE-acetaminophen (PERCOCET/ROXICET) 5-325 MG tablet Take 1-2 tablets by mouth every 6 (six) hours as needed for severe pain. (Patient not taking: Reported on 04/03/2018) 20 tablet 0 Not Taking   Allergies  Allergen Reactions  . Doxycycline Hyclate Hives and Shortness Of Breath  . Flagyl [Metronidazole] Rash    Social History   Tobacco Use  . Smoking status: Never Smoker  . Smokeless tobacco: Never Used  Substance Use Topics  . Alcohol use: Yes    Alcohol/week: 1.0 standard drinks    Types: 1 Glasses of wine per week    Comment: daily    No family history on file.   Review of Systems  Musculoskeletal: Positive for joint pain.  All other systems reviewed and are negative.   Objective:  Physical Exam  Constitutional: She is oriented to person, place, and time. She appears well-developed and  well-nourished.  HENT:  Head: Normocephalic and atraumatic.  Eyes: Pupils are equal, round, and reactive to light. EOM are normal.  Neck: Normal range of motion. Neck supple.  Cardiovascular: Normal rate and regular rhythm.  Respiratory: Effort normal and breath sounds normal.  GI: Soft. Bowel sounds are normal.  Musculoskeletal:       Left hip: She exhibits decreased range of motion, decreased strength, tenderness, bony tenderness and deformity.  Neurological: She is alert and oriented to person, place, and time.  Skin: Skin is warm and dry.  Psychiatric: She has a normal mood and affect.    Vital signs in last 24 hours:    Labs:   Estimated body mass index is 31.51 kg/m as calculated from the following:   Height as of 07/30/18: 4\' 11"  (1.499 m).   Weight as of 07/30/18: 70.8 kg.   Imaging Review Plain radiographs demonstrate severe degenerative joint disease of the left hip(s). The bone quality appears to be good for age and reported activity level.    Preoperative templating of the joint replacement has been completed, documented, and submitted to the Operating Room personnel in order to optimize intra-operative equipment management.     Assessment/Plan:  End stage arthritis, left hip(s)  The patient history, physical examination, clinical judgement of the provider and imaging studies are consistent with end stage degenerative joint disease of the left hip(s) and total hip arthroplasty is deemed medically necessary. The treatment options including medical management, injection therapy, arthroscopy and arthroplasty were discussed at length. The risks and benefits of total hip arthroplasty were presented and reviewed. The risks due to aseptic loosening, infection, stiffness, dislocation/subluxation,  thromboembolic complications and other imponderables were discussed.  The patient acknowledged the explanation, agreed to proceed with the plan and consent was signed. Patient  is being admitted for inpatient treatment for surgery, pain control, PT, OT, prophylactic antibiotics, VTE prophylaxis, progressive ambulation and ADL's and discharge planning.The patient is planning to be discharged home with home health services

## 2018-08-07 NOTE — Anesthesia Procedure Notes (Signed)
Spinal  Patient location during procedure: OR Start time: 08/07/2018 9:56 AM End time: 08/07/2018 9:58 AM Staffing Anesthesiologist: Kaylyn LayerHowze, Nysha Koplin E, MD Performed: anesthesiologist  Preanesthetic Checklist Completed: patient identified, surgical consent, pre-op evaluation, timeout performed, IV checked, risks and benefits discussed and monitors and equipment checked Spinal Block Patient position: sitting Prep: site prepped and draped and DuraPrep Patient monitoring: cardiac monitor, continuous pulse ox and blood pressure Approach: midline Location: L3-4 Injection technique: single-shot Needle Needle type: Pencan  Needle gauge: 24 G Needle length: 9 cm Additional Notes Risks, benefits, and alternative discussed. Patient gave consent to procedure. Prepped and draped in sitting position. Clear CSF obtained after one needle pass. Positive terminal aspiration. No pain or paraesthesias with injection. Patient tolerated procedure well. Vital signs stable. Amalia GreenhouseKE Kadeen Sroka, MD

## 2018-08-07 NOTE — Progress Notes (Signed)
PHARMACIST - PHYSICIAN ORDER COMMUNICATION  CONCERNING: P&T Medication Policy on Herbal Medications  DESCRIPTION:  This patient's order for:  Biotin  has been noted.  This product(s) is classified as an "herbal" or natural product. Due to a lack of definitive safety studies or FDA approval, nonstandard manufacturing practices, plus the potential risk of unknown drug-drug interactions while on inpatient medications, the Pharmacy and Therapeutics Committee does not permit the use of "herbal" or natural products of this type within [facility].   ACTION TAKEN: The pharmacy department is unable to verify this order at this time and your patient has been informed of this safety policy. Please reevaluate patient's clinical condition at discharge and address if the herbal or natural product(s) should be resumed at that time.  Loralee PacasErin Jovannie Ulibarri, PharmD, BCPS Pharmacy: [phone] 08/07/2018 3:11 PM

## 2018-08-07 NOTE — Evaluation (Signed)
Physical Therapy Evaluation Patient Details Name: Brenda Mccall MRN: [id number] DOB: March 28, 1951 Today's Date: 08/07/2018   History of Present Illness  67 yo female s/p L DA-THA on 08/07/18. PMH includes OA, kidney stones, renal disorders, fall 2 years ago, discectomy 11/2017.  Clinical Impression   Pt presents with mild to moderate L hip pain, antalgic gait, difficulty performing mobility tasks, and decreased tolerance for ambulation. Pt with feelings of "wooziness" after ambulation once sitting in recliner, BP 93/58 and HR of 75 bpm. Pt to benefit from acute PT to address deficits. Pt ambulated 20 ft with min guard assist with chair follow for safety with use of RW. Pt educated on quad sets (5-10/hour), ankle pumps (20/hour), and heel slides (5-10/hour) to perform this afternoon/evening to lessen stiffness and increase circulation, to pt's tolerance and limited by pain. PT to progress mobility as tolerated, and will continue to follow acutely.      Follow Up Recommendations Follow surgeon's recommendation for DC plan and follow-up therapies;Supervision for mobility/OOB(HHPT)    Equipment Recommendations  None recommended by PT    Recommendations for Other Services       Precautions / Restrictions Precautions Precautions: Fall Restrictions Weight Bearing Restrictions: No Other Position/Activity Restrictions: WBAT       Mobility  Bed Mobility Overal bed mobility: Needs Assistance Bed Mobility: Supine to Sit     Supine to sit: Min assist;HOB elevated     General bed mobility comments: Min assist for RLE management, increased time and effort to get to EOB.   Transfers Overall transfer level: Needs assistance Equipment used: Rolling walker (2 wheeled) Transfers: Sit to/from Stand Sit to Stand: Min guard;From elevated surface         General transfer comment: Min guard for safety. Verbal cuing for hand placement.   Ambulation/Gait Ambulation/Gait assistance: Min  guard;+2 safety/equipment(chair follow) Gait Distance (Feet): 20 Feet Assistive device: Rolling walker (2 wheeled) Gait Pattern/deviations: Step-to pattern;Decreased stride length;Antalgic;Decreased stance time - left;Decreased weight shift to left Gait velocity: decr    General Gait Details: Min guard for safety, verbal cuing for placement in RW, sequencing. Pt sat after 20 ft ambulation, and reported feeling woozy. BP 93/58, HR 75 bpm. Pt reclined, cool washcloth applied to forehead, and legs elevated. Pt with decreased wooziness after ~2 minutes.   Stairs            Wheelchair Mobility    Modified Rankin (Stroke Patients Only)       Balance Overall balance assessment: Mild deficits observed, not formally tested                                           Pertinent Vitals/Pain Pain Assessment: 0-10 Pain Score: 4  Pain Location: L hip  Pain Descriptors / Indicators: Aching Pain Intervention(s): Limited activity within patient's tolerance;Repositioned;Monitored during session;Ice applied    Home Living Family/patient expects to be discharged to:: Private residence Living Arrangements: Spouse/significant other Available Help at Discharge: Family;Available PRN/intermittently Type of Home: House Home Access: Stairs to enter Entrance Stairs-Rails: Right Entrance Stairs-Number of Steps: 2 Home Layout: One level Home Equipment: Shower seat - built in;Other (comment);Walker - 2 wheels;Cane - single point(frame around toilet)      Prior Function Level of Independence: Independent with assistive device(s)         Comments: using cane as needed for mobility     Hand  Dominance   Dominant Hand: Right    Extremity/Trunk Assessment   Upper Extremity Assessment Upper Extremity Assessment: Overall WFL for tasks assessed    Lower Extremity Assessment Lower Extremity Assessment: Overall WFL for tasks assessed;LLE deficits/detail LLE Deficits / Details:  suspected post-surgical hip weakness; able to perform quad sets, ankle pumps, SLR with PT assist, heel slides  LLE Sensation: WNL    Cervical / Trunk Assessment Cervical / Trunk Assessment: Normal  Communication   Communication: No difficulties  Cognition Arousal/Alertness: Awake/alert Behavior During Therapy: WFL for tasks assessed/performed Overall Cognitive Status: Within Functional Limits for tasks assessed                                        General Comments      Exercises     Assessment/Plan    PT Assessment Patient needs continued PT services  PT Problem List Decreased strength;Pain;Decreased activity tolerance;Decreased knowledge of use of DME;Decreased balance;Decreased safety awareness;Decreased mobility       PT Treatment Interventions DME instruction;Therapeutic activities;Therapeutic exercise;Gait training;Patient/family education;Stair training;Balance training;Functional mobility training    PT Goals (Current goals can be found in the Care Plan section)  Acute Rehab PT Goals Patient Stated Goal: walk better  PT Goal Formulation: With patient Time For Goal Achievement: 08/14/18 Potential to Achieve Goals: Good    Frequency 7X/week   Barriers to discharge        Co-evaluation               AM-PAC PT "6 Clicks" Mobility  Outcome Measure Help needed turning from your back to your side while in a flat bed without using bedrails?: A Little Help needed moving from lying on your back to sitting on the side of a flat bed without using bedrails?: A Little Help needed moving to and from a bed to a chair (including a wheelchair)?: A Little Help needed standing up from a chair using your arms (e.g., wheelchair or bedside chair)?: A Little Help needed to walk in hospital room?: A Little Help needed climbing 3-5 steps with a railing? : A Little 6 Click Score: 18    End of Session Equipment Utilized During Treatment: Gait belt Activity  Tolerance: Patient tolerated treatment well;Other (comment)(Pt limited by feeling faint) Patient left: in chair;with chair alarm set;with call bell/phone within reach;with family/visitor present;with SCD's reapplied Nurse Communication: Mobility status PT Visit Diagnosis: Other abnormalities of gait and mobility (R26.89);Difficulty in walking, not elsewhere classified (R26.2)    Time: 1635-1710 PT Time Calculation (min) (ACUTE ONLY): 35 min   Charges:   PT Evaluation $PT Eval Low Complexity: 1 Low PT Treatments $Gait Training: 8-22 mins        Nicola PoliceAlexa D Taniyah Ballow, PT Acute Rehabilitation Services Pager [phone]  Office [phone]   Detria Cummings D Despina Hiddenure 08/07/2018, 5:57 PM

## 2018-08-07 NOTE — Transfer of Care (Signed)
Immediate Anesthesia Transfer of Care Note  Patient: Brenda Mccall  Procedure(s) Performed: Procedure(s): LEFT TOTAL HIP ARTHROPLASTY ANTERIOR APPROACH (Left)  Patient Location: PACU  Anesthesia Type:Spinal  Level of Consciousness:  sedated, patient cooperative and responds to stimulation  Airway & Oxygen Therapy:Patient Spontanous Breathing and Patient connected to face mask oxgen  Post-op Assessment:  Report given to PACU RN and Post -op Vital signs reviewed and stable  Post vital signs:  Reviewed and stable  Last Vitals:  Vitals:   08/07/18 0727  BP: (!) 162/97  Pulse: 86  Resp: 16  Temp: 36.7 C  SpO2: 99%    Complications: No apparent anesthesia complications

## 2018-08-07 NOTE — Brief Op Note (Signed)
08/07/2018  11:08 AM  PATIENT:  Brenda Mccall  67 y.o. female  PRE-OPERATIVE DIAGNOSIS:  osteoarthritis left hip  POST-OPERATIVE DIAGNOSIS:  osteoarthritis left hip  PROCEDURE:  Procedure(s): LEFT TOTAL HIP ARTHROPLASTY ANTERIOR APPROACH (Left)  SURGEON:  Surgeon(s) and Role:    Kathryne Hitch* Blackman, Christopher Y, MD - Primary  PHYSICIAN ASSISTANT: Rexene EdisonGil Clark, PA-C   ANESTHESIA:   spinal  EBL:  150 mL   COUNTS:  YES  TOURNIQUET:  * No tourniquets in log *  PLAN OF CARE: Admit to inpatient   PATIENT DISPOSITION:  PACU - hemodynamically stable.   Delay start of Pharmacological VTE agent (>24hrs) due to surgical blood loss or risk of bleeding: no

## 2018-08-07 NOTE — Anesthesia Postprocedure Evaluation (Signed)
Anesthesia Post Note  Patient: Brenda Mccall  Procedure(s) Performed: LEFT TOTAL HIP ARTHROPLASTY ANTERIOR APPROACH (Left Hip)     Patient location during evaluation: PACU Anesthesia Type: Spinal Level of consciousness: awake and alert Pain management: pain level controlled Vital Signs Assessment: post-procedure vital signs reviewed and stable Respiratory status: spontaneous breathing, nonlabored ventilation and respiratory function stable Cardiovascular status: blood pressure returned to baseline and stable Postop Assessment: no apparent nausea or vomiting and spinal receding Anesthetic complications: no    Last Vitals:  Vitals:   08/07/18 1200 08/07/18 1215  BP: (!) 125/59   Pulse: 63 (!) 59  Resp: 14 17  Temp:    SpO2: 100% 100%    Last Pain:  Vitals:   08/07/18 1200  TempSrc:   PainSc: 0-No pain                 Brenda Mccall

## 2018-08-07 NOTE — Op Note (Signed)
NAMBufford Lope: Haynesworth, Keundra G. MEDICAL RECORD [id number] ACCOUNT [id number] DATE OF BIRTH:Aug 19, 1951 FACILITY: [facility]: [facility] PHYSICIAN:Lylian Sanagustin Aretha ParrotY. Dewane Timson, MD  OPERATIVE REPORT  DATE OF PROCEDURE:  08/07/2018  PREOPERATIVE DIAGNOSIS:  Primary osteoarthritis and degenerative joint disease, left hip.  POSTOPERATIVE DIAGNOSIS:  Primary osteoarthritis and degenerative joint disease, left hip.  PROCEDURE:  Left total hip arthroplasty through direct anterior approach.  IMPLANTS:  DePuy Sector Gription acetabular component size 50, size 32+0 neutral polyethylene liner, size 11 Corail femoral component with standard offset, size 32+1 metal hip ball.  SURGEON:  Vanita PandaChristopher Y. Magnus IvanBlackman, MD  ASSISTANT:  Richardean CanalGilbert Clark, PA-C.  ANESTHESIA:  Spinal.  ANTIBIOTICS:  Two grams IV Ancef.  ESTIMATED BLOOD LOSS:  200 mL  COMPLICATIONS:  None.  INDICATIONS:  The patient is a very pleasant 67 year old female with debilitating arthritis involving her left hip.  This has been well documented on her clinical exam and x-ray findings.  Her x-rays show complete loss of joint space with sclerotic  changes in the femoral head as well.  Her pain is daily and has detrimentally affected activities of daily living, her quality of life and mobility to the point she does wish to proceed with a total hip arthroplasty.  She understands the risk of acute  blood loss anemia, nerve or vessel injury, fracture, infection, dislocation, DVT and implant failure.  She understands her goals are to decrease pain, improve mobility and overall improve quality of life.  DESCRIPTION OF PROCEDURE:  After informed consent was obtained and appropriate left hip was marked, she was brought to the operating room and sat up on a stretcher where spinal anesthesia was obtained.  We then laid her in supine position.  Leg lengths  were assessed and a Foley catheter was placed as well.  We then placed traction boots on both her feet and  placed her supine on the Hana fracture table with a perineal post in place and both legs in line skeletal traction device and no traction applied.   Her left operative hip was prepped and draped with DuraPrep and sterile drapes.  A time-out was called to identify correct patient, correct left hip.  We then made an incision just inferior and posterior to the anterior superior iliac spine and carried  this obliquely down the leg.  We dissected down tensor fascia lata muscle.  Tensor fascia was then divided longitudinally to proceed with direct anterior approach to the hip.  We identified and cauterized circumflex vessels.  I then identified the hip  capsule, elevated the hip capsule in an L-type format, finding moderate joint effusion and significant periarticular osteophytes around the left hip.  We placed Cobra retractor on the medial and lateral femoral neck and made our femoral neck cut with an  oscillating saw and completed this with an osteotome.  We placed a corkscrew guide in the femoral head and removed the femoral head in its entirety and found a large area devoid of cartilage.  We then placed a bent Hohmann over the medial acetabular rim  and removed remnants of the acetabulum, labrum and other debris.  We then began broaching from the arm began reaming from a size 44 reamer in stepwise increments up to a size 49 with all reamers under direct visualization, the last reamer under direct  fluoroscopy, so we could obtain our depth of reaming, our inclination and anteversion.  We then placed the real DePuy Sector Gription acetabular component size 50 and a 32+0 neutral polyethylene liner for that size  50 acetabular component.  Attention was  then turned to the femur.  With the leg externally rotated to 120 degrees, extended and adducted, we were able to place a Mueller retractor medially and a Hohmann retractor above the greater trochanter.  We released lateral joint capsule and used a  box-cutting  osteotome to enter femoral canal and a rongeur to lateralize.  We began broaching from a size 8 broach using Corail broaching system going up to a size 11.  With the 11 in place, we trialed a standard offset femoral neck and a 32+1 hip ball,  reduced this in the acetabulum and I was pleased with the offset leg length, range of motion and stability.  On x-ray, it looks like she is slightly longer, but on clinical exam, she feels like she is on.  We then dislocated the hip and removed the trial  components.  We placed the real Corail femoral component size 11 with standard offset and the real 32+1 metal hip ball reduced this in the acetabulum and again it was stable.  We then irrigated the soft tissue with normal saline solution using pulsatile  lavage.  We closed the joint capsule with interrupted #1 Ethibond suture, followed by running 0 Vicryl in tensor fascia, 0 Vicryl in the deep tissue, 2-0 Vicryl subcutaneous tissue and interrupted staples on the skin.  Xeroform well-padded sterile  dressing was applied.  She was taken off the Hana table and taken to recovery room in stable condition.  All final counts were correct.  There were no complications noted.  Of note, Rexene Edison, PA-C, assisted in the entire case.  His assistance was  crucial for facilitating all aspects of this case.  TN/NUANCE  D:08/07/2018 T:08/07/2018 JOB:[id number]

## 2018-08-08 LAB — CBC
HCT: 34.5 % — ABNORMAL LOW (ref 36.0–46.0)
Hemoglobin: 10.8 g/dL — ABNORMAL LOW (ref 12.0–15.0)
MCH: 31.2 pg (ref 26.0–34.0)
MCHC: 31.3 g/dL (ref 30.0–36.0)
MCV: 99.7 fL (ref 80.0–100.0)
NRBC: 0 % (ref 0.0–0.2)
Platelets: 207 10*3/uL (ref 150–400)
RBC: 3.46 MIL/uL — ABNORMAL LOW (ref 3.87–5.11)
RDW: 12.6 % (ref 11.5–15.5)
WBC: 12.6 10*3/uL — ABNORMAL HIGH (ref 4.0–10.5)

## 2018-08-08 LAB — BASIC METABOLIC PANEL
ANION GAP: 10 (ref 5–15)
BUN: 8 mg/dL (ref 8–23)
CO2: 24 mmol/L (ref 22–32)
Calcium: 8.7 mg/dL — ABNORMAL LOW (ref 8.9–10.3)
Chloride: 105 mmol/L (ref 98–111)
Creatinine, Ser: 0.57 mg/dL (ref 0.44–1.00)
GFR calc non Af Amer: >60 mL/min (ref >60)
Glucose, Bld: 146 mg/dL — ABNORMAL HIGH (ref 70–99)
POTASSIUM: 4.1 mmol/L (ref 3.5–5.1)
SODIUM: 139 mmol/L (ref 135–145)

## 2018-08-08 MED ORDER — METHOCARBAMOL 500 MG PO TABS: 500.0000 mg | ORAL_TABLET | Freq: Four times a day (QID) | ORAL | 0 refills | Status: DC | PRN

## 2018-08-08 MED ORDER — ASPIRIN 81 MG PO CHEW: 81.0000 mg | CHEWABLE_TABLET | Freq: Two times a day (BID) | ORAL | 0 refills | Status: DC

## 2018-08-08 MED ORDER — LORAZEPAM 0.5 MG PO TABS
0.5000 mg | ORAL_TABLET | Freq: Every day | ORAL | Status: DC
  Administered 2018-08-08: 0.5 mg via ORAL
  Filled 2018-08-08: qty 1

## 2018-08-08 MED ORDER — OXYCODONE HCL 5 MG PO TABS: 5.0000 mg | ORAL_TABLET | ORAL | 0 refills | Status: DC | PRN

## 2018-08-08 NOTE — Progress Notes (Signed)
Subjective: 1 Day Post-Op Procedure(s) (LRB): LEFT TOTAL HIP ARTHROPLASTY ANTERIOR APPROACH (Left) Patient reports pain as moderate.  Already has been up with therapy.  Objective: Vital signs in last 24 hours: Temp:  [97.6 F (36.4 C)-98.3 F (36.8 C)] 98.3 F (36.8 C) (11/23 0524) Pulse Rate:  [59-94] 78 (11/23 0524) Resp:  [8-26] 16 (11/23 0524) BP: (105-149)/(57-100) 114/67 (11/23 0524) SpO2:  [92 %-100 %] 95 % (11/23 0829)  Intake/Output from previous day: 11/22 0701 - 11/23 0700 In: 3699.6 [P.O.:360; I.V.:3089.6; IV Piggyback:250] Out: 3700 [Urine:3550; Blood:150] Intake/Output this shift: No intake/output data recorded.  Recent Labs    08/08/18 0403  HGB 10.8*   Recent Labs    08/08/18 0403  WBC 12.6*  RBC 3.46*  HCT 34.5*  PLT 207   Recent Labs    08/08/18 0403  NA 139  K 4.1  CL 105  CO2 24  BUN 8  CREATININE 0.57  GLUCOSE 146*  CALCIUM 8.7*   No results for input(s): LABPT, INR in the last 72 hours.  Sensation intact distally Intact pulses distally Dorsiflexion/Plantar flexion intact Incision: dressing C/D/I  Assessment/Plan: 1 Day Post-Op Procedure(s) (LRB): LEFT TOTAL HIP ARTHROPLASTY ANTERIOR APPROACH (Left) Up with therapy Plan for discharge tomorrow Discharge home with home health    Kathryne HitchChristopher Y Alizzon Dioguardi 08/08/2018, 10:34 AM

## 2018-08-08 NOTE — Progress Notes (Signed)
Physical Therapy Treatment Patient Details Name: Brenda Mccall MRN: [id number] DOB: 09-25-1950 Today's Date: 08/08/2018    History of Present Illness 67 yo female s/p L DA-THA on 08/07/18. PMH includes OA, kidney stones, renal disorders, fall 2 years ago, discectomy 11/2017.    PT Comments    Pt continues motivated but ltd this pm by c/o dizziness during ambulation.  Pt moved to sitting and returned to room - BP 80/30 - RN aware.   Follow Up Recommendations  Follow surgeon's recommendation for DC plan and follow-up therapies;Supervision for mobility/OOB     Equipment Recommendations  None recommended by PT    Recommendations for Other Services       Precautions / Restrictions Precautions Precautions: Fall Restrictions Weight Bearing Restrictions: No Other Position/Activity Restrictions: WBAT     Mobility  Bed Mobility Overal bed mobility: Needs Assistance Bed Mobility: Supine to Sit;Sit to Supine     Supine to sit: Min assist Sit to supine: Min assist   General bed mobility comments: cues for sequence and use of R LE to self assist  Transfers Overall transfer level: Needs assistance Equipment used: Rolling walker (2 wheeled) Transfers: Sit to/from Stand Sit to Stand: Min assist         General transfer comment: Min guard for safety. Verbal cuing for hand placement.   Ambulation/Gait Ambulation/Gait assistance: Min guard Gait Distance (Feet): 75 Feet Assistive device: Rolling walker (2 wheeled) Gait Pattern/deviations: Step-to pattern;Step-through pattern;Decreased step length - right;Decreased step length - left;Shuffle;Trunk flexed Gait velocity: decr    General Gait Details: cues for sequence, posture and position from RW; pt ltd by c/o dizziness and moved to sitting  - BP 80/30   Stairs             Wheelchair Mobility    Modified Rankin (Stroke Patients Only)       Balance Overall balance assessment: Mild deficits observed, not formally  tested                                          Cognition Arousal/Alertness: Awake/alert Behavior During Therapy: WFL for tasks assessed/performed Overall Cognitive Status: Within Functional Limits for tasks assessed                                        Exercises Total Joint Exercises Ankle Circles/Pumps: AROM;Both;15 reps;Supine Quad Sets: AROM;Both;10 reps;Supine Heel Slides: AAROM;Left;20 reps;Supine Hip ABduction/ADduction: AAROM;Left;15 reps;Supine    General Comments        Pertinent Vitals/Pain Pain Assessment: 0-10 Pain Score: 4  Pain Location: L hip  Pain Descriptors / Indicators: Aching;Sore Pain Intervention(s): Limited activity within patient's tolerance;Monitored during session;Premedicated before session;Ice applied    Home Living                      Prior Function            PT Goals (current goals can now be found in the care plan section) Acute Rehab PT Goals Patient Stated Goal: walk better and play with grandkids PT Goal Formulation: With patient Time For Goal Achievement: 08/14/18 Potential to Achieve Goals: Good Progress towards PT goals: Progressing toward goals    Frequency    7X/week      PT Plan Current plan remains appropriate  Co-evaluation              AM-PAC PT "6 Clicks" Mobility   Outcome Measure  Help needed turning from your back to your side while in a flat bed without using bedrails?: A Little Help needed moving from lying on your back to sitting on the side of a flat bed without using bedrails?: A Little Help needed moving to and from a bed to a chair (including a wheelchair)?: A Little Help needed standing up from a chair using your arms (e.g., wheelchair or bedside chair)?: A Little Help needed to walk in hospital room?: A Little Help needed climbing 3-5 steps with a railing? : A Little 6 Click Score: 18    End of Session Equipment Utilized During Treatment:  Gait belt Activity Tolerance: Other (comment)(orthostatic) Patient left: in bed;with call bell/phone within reach;with family/visitor present Nurse Communication: Mobility status PT Visit Diagnosis: Other abnormalities of gait and mobility (R26.89);Difficulty in walking, not elsewhere classified (R26.2)     Time: [id number] PT Time Calculation (min) (ACUTE ONLY): 35 min  Charges:  $Gait Training: 8-22 mins $Therapeutic Exercise: 8-22 mins                     Mauro KaufmannHunter Jag Lenz PT Acute Rehabilitation Services Pager [phone] Office [phone]    Brenda Mccall 08/08/2018, 5:02 PM

## 2018-08-08 NOTE — Care Management Note (Signed)
Case Management Note  Patient Details  Name: Brenda Mccall MRN: [id number] Date of Birth: 09/01/1951  Subjective/Objective: 67 yo F s/p L DA-THA.                  Action/Plan: Received referral to assist with [facility] and DME  Expected Discharge Date:  08/08/18               Expected Discharge Plan:  [place]  In-House Referral:     Discharge planning Services  CM Consult  Post Acute Care Choice:  Home Health Choice offered to:  Patient  DME Arranged:    DME Agency:     HH Arranged:  PT [place]:  [facility] (now [facility])  Status of Service:  Completed, signed off  If discussed at [employer] of Stay Meetings, dates discussed:    Additional Comments: Met with pt. She plans to return home with the support of her husband. She has a RW and an elevated toilet seat with arms. Discussed HHPT. Referral made to [facility] prior to surgery by the physician's office and pt agrees with agency.   Norina Buzzard, RN 08/08/2018, 12:06 PM

## 2018-08-08 NOTE — Evaluation (Signed)
Occupational Therapy Evaluation Patient Details Name: Brenda Mccall MRN: [id number] DOB: [birth year] Today's Date: 08/08/2018    History of Present Illness 67 yo female s/p L DA-THA on 08/07/18. PMH includes OA, kidney stones, renal disorders, fall 2 years ago, discectomy 11/2017.   Clinical Impression   Pt is s/p THA resulting in the deficits listed below (see OT Problem List).  Pt will benefit from skilled OT to increase their safety and independence with ADL and functional mobility for ADL to facilitate discharge to venue listed below.        Follow Up Recommendations  No OT follow up    Equipment Recommendations  None recommended by OT    Recommendations for Other Services       Precautions / Restrictions Precautions Precautions: Fall Restrictions Weight Bearing Restrictions: No      Mobility Bed Mobility Overal bed mobility: Needs Assistance Bed Mobility: Supine to Sit;Sit to Supine     Supine to sit: Min assist;HOB elevated Sit to supine: Min assist   General bed mobility comments: Min assist for RLE management, increased time and effort to get to EOB.   Transfers Overall transfer level: Needs assistance Equipment used: Rolling walker (2 wheeled) Transfers: Sit to/from Stand Sit to Stand: Min assist              Balance Overall balance assessment: Mild deficits observed, not formally tested                                         ADL either performed or assessed with clinical judgement   ADL Overall ADL's : Needs assistance/impaired Eating/Feeding: Set up;Sitting   Grooming: Set up;Sitting   Upper Body Bathing: Set up;Sitting   Lower Body Bathing: Sit to/from stand;Cueing for sequencing;Cueing for safety;Moderate assistance   Upper Body Dressing : Set up;Sitting   Lower Body Dressing: Moderate assistance;Sit to/from stand;Cueing for sequencing;Cueing for safety   Toilet Transfer: Minimal assistance;RW   Toileting- Clothing  Manipulation and Hygiene: Minimal assistance;Sit to/from stand               Vision Patient Visual Report: No change from baseline              Pertinent Vitals/Pain Pain Score: 2  Pain Descriptors / Indicators: Aching Pain Intervention(s): Monitored during session     Hand Dominance Right   Extremity/Trunk Assessment Upper Extremity Assessment Upper Extremity Assessment: Overall WFL for tasks assessed           Communication Communication Communication: No difficulties   Cognition Arousal/Alertness: Awake/alert Behavior During Therapy: WFL for tasks assessed/performed Overall Cognitive Status: Within Functional Limits for tasks assessed                                                Home Living Family/patient expects to be discharged to:: Private residence Living Arrangements: Spouse/significant other Available Help at Discharge: Family;Available PRN/intermittently Type of Home: House Home Access: Stairs to enter Entergy CorporationEntrance Stairs-Number of Steps: 2 Entrance Stairs-Rails: Right Home Layout: One level     Bathroom Shower/Tub: Tub/shower unit;Walk-in shower   Bathroom Toilet: Handicapped height Bathroom Accessibility: Yes   Home Equipment: Shower seat - built in;Other (comment);Walker - 2 wheels;Cane - single point(frame around toilet)  Prior Functioning/Environment Level of Independence: Independent with assistive device(s)        Comments: using cane as needed for mobility        OT Problem List: Decreased strength;Decreased knowledge of use of DME or AE      OT Treatment/Interventions: Self-care/ADL training;Patient/family education;DME and/or AE instruction    OT Goals(Current goals can be found in the care plan section) Acute Rehab OT Goals Patient Stated Goal: walk better and play with grandkids OT Goal Formulation: With patient Time For Goal Achievement: 08/15/18 Potential to Achieve Goals: Good  OT  Frequency: Min 2X/week   Barriers to D/C:               AM-PAC OT "6 Clicks" Daily Activity     Outcome Measure Help from another person eating meals?: None Help from another person taking care of personal grooming?: A Little Help from another person toileting, which includes using toliet, bedpan, or urinal?: A Little Help from another person bathing (including washing, rinsing, drying)?: A Little Help from another person to put on and taking off regular upper body clothing?: None Help from another person to put on and taking off regular lower body clothing?: A Little 6 Click Score: 20   End of Session Equipment Utilized During Treatment: Rolling walker Nurse Communication: Mobility status  Activity Tolerance: Patient tolerated treatment well Patient left: in bed  OT Visit Diagnosis: Unsteadiness on feet (R26.81);Other abnormalities of gait and mobility (R26.89);Repeated falls (R29.6);History of falling (Z91.81);Muscle weakness (generalized) (M62.81)                Time: 1610-9604 OT Time Calculation (min): 32 min Charges:  OT General Charges $OT Visit: 1 Visit OT Evaluation $OT Eval Low Complexity: 1 Low OT Treatments $Self Care/Home Management : 8-22 mins  Lise Auer, OT Acute Rehabilitation Services [phone] Office- [phone]     Marthena Whitmyer, Karin Golden D 08/08/2018, 11:32 AM

## 2018-08-08 NOTE — Discharge Instructions (Signed)

## 2018-08-08 NOTE — Progress Notes (Addendum)
Physical Therapy Treatment Patient Details Name: Brenda LopeRuth G Abramovich MRN: [id number] DOB: 1950/12/28 Today's Date: 08/08/2018    History of Present Illness 67 yo female s/p L DA-THA on 08/07/18. PMH includes OA, kidney stones, renal disorders, fall 2 years ago, discectomy 11/2017.    PT Comments    Pt motivated and progressing with mobility.  Pt hopeful for dc home tomorrow.   Follow Up Recommendations  Follow surgeon's recommendation for DC plan and follow-up therapies;Supervision for mobility/OOB     Equipment Recommendations  None recommended by PT    Recommendations for Other Services       Precautions / Restrictions Precautions Precautions: Fall Restrictions Weight Bearing Restrictions: No Other Position/Activity Restrictions: WBAT     Mobility  Bed Mobility Overal bed mobility: Needs Assistance Bed Mobility: Supine to Sit     Supine to sit: Min assist;HOB elevated Sit to supine: Min assist   General bed mobility comments: Pt rolled to side to exit bed 2* recent back surgery.  Assist to control LEs and bring trunk to upright  Transfers Overall transfer level: Needs assistance Equipment used: Rolling walker (2 wheeled) Transfers: Sit to/from Stand Sit to Stand: Min assist         General transfer comment: Min guard for safety. Verbal cuing for hand placement.   Ambulation/Gait Ambulation/Gait assistance: Min guard Gait Distance (Feet): 150 Feet Assistive device: Rolling walker (2 wheeled) Gait Pattern/deviations: Step-to pattern;Step-through pattern;Decreased step length - right;Decreased step length - left;Shuffle;Trunk flexed Gait velocity: decr    General Gait Details: cues for sequence, posture and position from [employer]   Stairs             Wheelchair Mobility    Modified Rankin (Stroke Patients Only)       Balance Overall balance assessment: Mild deficits observed, not formally tested                                           Cognition Arousal/Alertness: Awake/alert Behavior During Therapy: WFL for tasks assessed/performed Overall Cognitive Status: Within Functional Limits for tasks assessed                                        Exercises   Total Joint Exercises Ankle Circles/Pumps: AROM;Both;15 reps;Supine Quad Sets: AROM;Both;10 reps;Supine Heel Slides: AAROM;Left;20 reps;Supine Hip ABduction/ADduction: AAROM;Left;15 reps;Supine    General Comments        Pertinent Vitals/Pain Pain Assessment: 0-10 Pain Score: 5  Pain Location: L hip  Pain Descriptors / Indicators: Aching Pain Intervention(s): Limited activity within patient's tolerance;Monitored during session;Premedicated before session;Ice applied    Home Living Family/patient expects to be discharged to:: Private residence Living Arrangements: Spouse/significant other Available Help at Discharge: Family;Available PRN/intermittently Type of Home: House Home Access: Stairs to enter Entrance Stairs-Rails: Right Home Layout: One level Home Equipment: Shower seat - built in;Other (comment);Walker - 2 wheels;Cane - single point(frame around toilet)      Prior Function Level of Independence: Independent with assistive device(s)      Comments: using cane as needed for mobility   PT Goals (current goals can now be found in the care plan section) Acute Rehab PT Goals Patient Stated Goal: walk better and play with grandkids PT Goal Formulation: With patient Time For Goal Achievement: 08/14/18 Potential to Achieve  Goals: Good Progress towards PT goals: Progressing toward goals    Frequency    7X/week      PT Plan Current plan remains appropriate    Co-evaluation              AM-PAC PT "6 Clicks" Mobility   Outcome Measure  Help needed turning from your back to your side while in a flat bed without using bedrails?: A Little Help needed moving from lying on your back to sitting on the side of a flat bed  without using bedrails?: A Little Help needed moving to and from a bed to a chair (including a wheelchair)?: A Little Help needed standing up from a chair using your arms (e.g., wheelchair or bedside chair)?: A Little Help needed to walk in hospital room?: A Little Help needed climbing 3-5 steps with a railing? : A Little 6 Click Score: 18    End of Session Equipment Utilized During Treatment: Gait belt Activity Tolerance: Patient tolerated treatment well;Other (comment) Patient left: in chair;with chair alarm set;with call bell/phone within reach;with family/visitor present;with SCD's reapplied Nurse Communication: Mobility status PT Visit Diagnosis: Other abnormalities of gait and mobility (R26.89);Difficulty in walking, not elsewhere classified (R26.2)     Time: 1610-9604 PT Time Calculation (min) (ACUTE ONLY): 41 min  Charges:  $Gait Training: 8-22 mins $Therapeutic Exercise: 8-22 mins $Therapeutic Activity: 8-22 mins                     Mauro Kaufmann PT Acute Rehabilitation Services Pager [phone] Office [phone]    Whittaker Lenis 08/08/2018, 12:57 PM

## 2018-08-09 NOTE — Progress Notes (Signed)
Occupational Therapy Treatment Patient Details Name: Brenda Mccall MRN: [id number] DOB: 1951/02/20 Today's Date: 08/09/2018    History of present illness 67 yo female s/p L DA-THA on 08/07/18. PMH includes OA, kidney stones, renal disorders, fall 2 years ago, discectomy 11/2017.   OT comments  Husband will A as needed  Follow Up Recommendations  No OT follow up    Equipment Recommendations  None recommended by OT    Recommendations for Other Services      Precautions / Restrictions Precautions Precautions: Fall Restrictions Weight Bearing Restrictions: Yes LLE Weight Bearing: Weight bearing as tolerated       Mobility Bed Mobility               General bed mobility comments: Pt OOB  Transfers Overall transfer level: Needs assistance Equipment used: Rolling walker (2 wheeled) Transfers: Sit to/from [employer] Sit to Stand: Supervision Stand pivot transfers: Supervision       General transfer comment: Pt demo safe technique.    Balance Overall balance assessment: Mild deficits observed, not formally tested                                         ADL either performed or assessed with clinical judgement   ADL Overall ADL's : Needs assistance/impaired Eating/Feeding: Modified independent   Grooming: Supervision/safety;Standing   Upper Body Bathing: Set up;Sitting   Lower Body Bathing: Sit to/from stand;Cueing for sequencing;Cueing for safety;Minimal assistance   Upper Body Dressing : Set up;Sitting   Lower Body Dressing: Sit to/from stand;Cueing for sequencing;Cueing for safety;Minimal assistance   Toilet Transfer: RW;Supervision/safety   Toileting- Clothing Manipulation and Hygiene: Sit to/from stand;Supervision/safety   Tub/ Shower Transfer: Walk-in shower;Minimal assistance;Cueing for sequencing;Cueing for safety   Functional mobility during ADLs: Supervision/safety;Cueing for safety;Cueing for  sequencing;Rolling walker General ADL Comments: husband will A as needed               Cognition Arousal/Alertness: Awake/alert Behavior During Therapy: WFL for tasks assessed/performed Overall Cognitive Status: Within Functional Limits for tasks assessed                                                     Pertinent Vitals/ Pain       Pain Assessment: 0-10 Pain Score: 2  Pain Location: L hip  Pain Descriptors / Indicators: Aching;Sore Pain Intervention(s): Limited activity within patient's tolerance;Repositioned         Frequency  Min 2X/week        Progress Toward Goals  OT Goals(current goals can now be found in the care plan section)     Acute Rehab OT Goals Patient Stated Goal: walk better and play with grandkids  Plan Discharge plan remains appropriate    AM-PAC OT "6 Clicks" Daily Activity     Outcome Measure   Help from another person eating meals?: None Help from another person taking care of personal grooming?: None Help from another person toileting, which includes using toliet, bedpan, or urinal?: A Little Help from another person bathing (including washing, rinsing, drying)?: A Little Help from another person to put on and taking off regular upper body clothing?: None Help from another person to put on and taking off regular lower body  clothing?: A Little 6 Click Score: 21    End of Session Equipment Utilized During Treatment: Rolling walker  OT Visit Diagnosis: Unsteadiness on feet (R26.81);Other abnormalities of gait and mobility (R26.89);Repeated falls (R29.6);History of falling (Z91.81);Muscle weakness (generalized) (M62.81)   Activity Tolerance Patient tolerated treatment well   Patient Left in bed   Nurse Communication Mobility status        Time: 1035-1050 OT Time Calculation (min): 15 min  Charges: OT General Charges $OT Visit: 1 Visit OT Treatments $Self Care/Home Management : 8-22 mins  Lise AuerLori Krisa Blattner,  OT Acute Rehabilitation Services [phone]- [phone] Office- [phone]      Lynlee Stratton, Karin GoldenLorraine D 08/09/2018, 12:33 PM

## 2018-08-09 NOTE — Progress Notes (Signed)
Physical Therapy Treatment Patient Details Name: Brenda Mccall MRN: [id number] DOB: Jan 25, 1951 Today's Date: 08/09/2018    History of Present Illness 67 yo female s/p L DA-THA on 08/07/18. PMH includes OA, kidney stones, renal disorders, fall 2 years ago, discectomy 11/2017.    PT Comments    Pt reports feeling much better today. Ambulating in room mod I with RW on PT arrival. Pt ambulated in hallway 200 feet with RW supervision. Stair training complete. Pt performed LE exercises at end of session. Plan is for d/c home today. All education complete.    Follow Up Recommendations  Follow surgeon's recommendation for DC plan and follow-up therapies;Supervision for mobility/OOB     Equipment Recommendations  None recommended by PT    Recommendations for Other Services       Precautions / Restrictions Precautions Precautions: Fall Restrictions Weight Bearing Restrictions: Yes LLE Weight Bearing: Weight bearing as tolerated    Mobility  Bed Mobility               General bed mobility comments: Pt received in bathroom.  Transfers   Equipment used: Rolling walker (2 wheeled) Transfers: Sit to/from Stand Sit to Stand: Supervision         General transfer comment: Pt demo safe technique.  Ambulation/Gait Ambulation/Gait assistance: Supervision Gait Distance (Feet): 200 Feet Assistive device: Rolling walker (2 wheeled) Gait Pattern/deviations: Step-through pattern;Decreased stride length;Antalgic;Decreased weight shift to left Gait velocity: decreased   General Gait Details: slow, steady gait   Stairs Stairs: Yes Stairs assistance: Min guard Stair Management: One rail Right;Sideways Number of Stairs: 2 General stair comments: cues for sequencing   Wheelchair Mobility    Modified Rankin (Stroke Patients Only)       Balance                                            Cognition Arousal/Alertness: Awake/alert Behavior During  Therapy: WFL for tasks assessed/performed Overall Cognitive Status: Within Functional Limits for tasks assessed                                        Exercises Total Joint Exercises Ankle Circles/Pumps: AROM;Both;20 reps Quad Sets: AROM;Both;10 reps Heel Slides: AAROM;Left;10 reps Hip ABduction/ADduction: AAROM;Left;10 reps    General Comments        Pertinent Vitals/Pain Pain Assessment: 0-10 Pain Score: 3  Pain Location: L hip  Pain Descriptors / Indicators: Aching;Sore Pain Intervention(s): Monitored during session    Home Living                      Prior Function            PT Goals (current goals can now be found in the care plan section) Acute Rehab PT Goals Patient Stated Goal: walk better and play with grandkids PT Goal Formulation: With patient Time For Goal Achievement: 08/14/18 Potential to Achieve Goals: Good Progress towards PT goals: Progressing toward goals    Frequency    7X/week      PT Plan Current plan remains appropriate    Co-evaluation              AM-PAC PT "6 Clicks" Mobility   Outcome Measure  Help needed turning from your back to your side while  in a flat bed without using bedrails?: None Help needed moving from lying on your back to sitting on the side of a flat bed without using bedrails?: A Little Help needed moving to and from a bed to a chair (including a wheelchair)?: None Help needed standing up from a chair using your arms (e.g., wheelchair or bedside chair)?: None Help needed to walk in hospital room?: A Little Help needed climbing 3-5 steps with a railing? : A Little 6 Click Score: 21    End of Session Equipment Utilized During Treatment: Gait belt Activity Tolerance: Patient tolerated treatment well Patient left: in chair;with call bell/phone within reach;with family/visitor present Nurse Communication: Mobility status PT Visit Diagnosis: Other abnormalities of gait and mobility  (R26.89);Difficulty in walking, not elsewhere classified (R26.2)     Time: 1015-1040 PT Time Calculation (min) (ACUTE ONLY): 25 min  Charges:  $Gait Training: 8-22 mins $Therapeutic Exercise: 8-22 mins                     Aida Raider, PT  Office # [phone] Pager [phone]    Ilda Foil 08/09/2018, 11:19 AM

## 2018-08-09 NOTE — Progress Notes (Signed)
   Subjective: 2 Days Post-Op Procedure(s) (LRB): LEFT TOTAL HIP ARTHROPLASTY ANTERIOR APPROACH (Left) Patient reports pain as mild and moderate.    Objective: Vital signs in last 24 hours: Temp:  [98.2 F (36.8 C)-99 F (37.2 C)] 99 F (37.2 C) (11/23 2203) Pulse Rate:  [75-107] 107 (11/24 0928) Resp:  [14-16] 15 (11/24 0928) BP: (109-146)/(60-81) 146/81 (11/24 0928) SpO2:  [93 %-100 %] 98 % (11/24 0928)  Intake/Output from previous day: 11/23 0701 - 11/24 0700 In: 1671.3 [P.O.:1320; I.V.:351.3] Out: 1100 [Urine:1100] Intake/Output this shift: Total I/O In: -  Out: 550 [Urine:550]  Recent Labs    08/08/18 0403  HGB 10.8*   Recent Labs    08/08/18 0403  WBC 12.6*  RBC 3.46*  HCT 34.5*  PLT 207   Recent Labs    08/08/18 0403  NA 139  K 4.1  CL 105  CO2 24  BUN 8  CREATININE 0.57  GLUCOSE 146*  CALCIUM 8.7*   No results for input(s): LABPT, INR in the last 72 hours.  Neurologically intact No results found.  Assessment/Plan: 2 Days Post-Op Procedure(s) (LRB): LEFT TOTAL HIP ARTHROPLASTY ANTERIOR APPROACH (Left) Plan: home today. Walking in halls. HHPT . ROV with Dr. Magnus IvanBlackman.   Eldred MangesMark C Nathania Waldman 08/09/2018, 10:23 AM

## 2018-08-09 NOTE — Progress Notes (Signed)
CSW consulted for SNF placement. Per notes, patient plans to discharge home w/ HH.  Signing off - please reconsult if discharge plans change.   Enid CutterLindsey Lenzie Montesano, MSW, LCSWA Clinical Social Work [phone]

## 2018-08-10 ENCOUNTER — Encounter (HOSPITAL_COMMUNITY): Payer: Self-pay | Admitting: Orthopaedic Surgery

## 2018-08-10 NOTE — Discharge Summary (Signed)
Patient ID: Brenda Mccall MRN: [id number] DOB/AGE: 04-16-1951 67 y.o.  Admit date: 08/07/2018 Discharge date: 08/10/2018  Admission Diagnoses:  Principal Problem:   Unilateral primary osteoarthritis, left hip Active Problems:   Status post total replacement of left hip   Discharge Diagnoses:  Same  Past Medical History:  Diagnosis Date  . Arthritis   . History of kidney stones   . Kidney stones   . Renal disorder     Surgeries: Procedure(s): LEFT TOTAL HIP ARTHROPLASTY ANTERIOR APPROACH on 08/07/2018   Consultants:   Discharged Condition: Improved  Hospital Course: Brenda Mccall is an 67 y.o. female who was admitted 08/07/2018 for operative treatment ofUnilateral primary osteoarthritis, left hip. Patient has severe unremitting pain that affects sleep, daily activities, and work/hobbies. After pre-op clearance the patient was taken to the operating room on 08/07/2018 and underwent  Procedure(s): LEFT TOTAL HIP ARTHROPLASTY ANTERIOR APPROACH.    Patient was given perioperative antibiotics:  Anti-infectives (From admission, onward)   Start     Dose/Rate Route Frequency Ordered Stop   08/07/18 1600  ceFAZolin (ANCEF) IVPB 1 g/50 mL premix     1 g 100 mL/hr over 30 Minutes Intravenous Every 6 hours 08/07/18 1504 08/07/18 2230   08/07/18 0730  ceFAZolin (ANCEF) IVPB 2g/100 mL premix     2 g 200 mL/hr over 30 Minutes Intravenous On call to O.R. 08/07/18 4098 08/07/18 0957       Patient was given sequential compression devices, early ambulation, and chemoprophylaxis to prevent DVT.  Patient benefited maximally from hospital stay and there were no complications.    Recent vital signs: No data found.   Recent laboratory studies:  Recent Labs    08/08/18 0403  WBC 12.6*  HGB 10.8*  HCT 34.5*  PLT 207  NA 139  K 4.1  CL 105  CO2 24  BUN 8  CREATININE 0.57  GLUCOSE 146*  CALCIUM 8.7*     Discharge Medications:   Allergies as of 08/09/2018      Reactions   Doxycycline Hyclate Hives, Shortness Of Breath   Flagyl [metronidazole] Rash      Medication List    STOP taking these medications   traMADol 50 MG tablet Commonly known as:  ULTRAM     TAKE these medications   aspirin 81 MG chewable tablet Chew 1 tablet (81 mg total) by mouth 2 (two) times daily.   b complex vitamins tablet Take 1 tablet by mouth daily.   Biotin 1000 MCG tablet Take 1,000 mcg by mouth daily.   cetirizine 10 MG tablet Commonly known as:  ZYRTEC Take 10 mg by mouth at bedtime.   fluticasone 50 MCG/ACT nasal spray Commonly known as:  FLONASE Place 2 sprays into both nostrils at bedtime as needed (congestion).   LORazepam 0.5 MG tablet Commonly known as:  ATIVAN Take 0.5 mg by mouth at bedtime.   meloxicam 15 MG tablet Commonly known as:  MOBIC Take 15 mg by mouth daily.   methocarbamol 500 MG tablet Commonly known as:  ROBAXIN Take 1 tablet (500 mg total) by mouth every 6 (six) hours as needed for muscle spasms.   multivitamin with minerals Tabs tablet Take 1 tablet by mouth daily.   oxyCODONE 5 MG immediate release tablet Commonly known as:  Oxy IR/ROXICODONE Take 1-2 tablets (5-10 mg total) by mouth every 4 (four) hours as needed for moderate pain (pain score 4-6).   sertraline 50 MG tablet Commonly known as:  ZOLOFT Take  50 mg by mouth daily.   tiZANidine 4 MG tablet Commonly known as:  ZANAFLEX Take 2 mg by mouth at bedtime as needed for muscle spasms.   Vitamin D3 125 MCG (5000 UT) Caps Take 5,000 Units by mouth daily.       Diagnostic Studies: Dg Pelvis Portable  Result Date: 08/07/2018 CLINICAL DATA:  Left hip replacement. EXAM: PORTABLE PELVIS 1-2 VIEWS COMPARISON:  08/07/2018. FINDINGS: Total left hip replacement. Anatomic alignment. Hardware intact. No acute bony abnormality. IMPRESSION: Total left hip replacement with anatomic alignment. Electronically Signed   By: Maisie Fushomas  Register   On: 08/07/2018 13:17   Dg C-arm 1-60  Min-no Report  Result Date: 08/07/2018 Fluoroscopy was utilized by the requesting physician.  No radiographic interpretation.   Dg Hip Operative Unilat W Or W/o Pelvis Left  Result Date: 08/07/2018 CLINICAL DATA:  Hip pain. EXAM: OPERATIVE LEFT HIP (WITH PELVIS IF PERFORMED) 2 VIEWS TECHNIQUE: Fluoroscopic spot image(s) were submitted for interpretation post-operatively. COMPARISON:  06/28/2015 FINDINGS: LEFT anterior total hip replacement.  No adverse features. IMPRESSION: Satisfactory position and alignment. Electronically Signed   By: Elsie StainJohn T Curnes M.D.   On: 08/07/2018 11:14    Disposition:     Follow-up Information    Brenda Mccall, Christopher Y, MD Follow up in 2 week(s).   Specialty:  Orthopedic Surgery Contact information: [address] [place] [address] [phone]        [facility] Follow up.   Specialty:  Home Health Services Why:  [facility] will provide home health physical therapy. Contact information: [address] [place] [address] [phone]            Signed: Richardean CanalGILBERT  08/10/2018, 5:50 PM
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
## 2018-08-20 ENCOUNTER — Ambulatory Visit (INDEPENDENT_AMBULATORY_CARE_PROVIDER_SITE_OTHER): Payer: Medicare Other | Admitting: Orthopaedic Surgery

## 2018-08-20 ENCOUNTER — Encounter (INDEPENDENT_AMBULATORY_CARE_PROVIDER_SITE_OTHER): Payer: Self-pay | Admitting: Orthopaedic Surgery

## 2018-08-20 DIAGNOSIS — Z96642 Presence of left artificial hip joint: Secondary | ICD-10-CM

## 2018-08-20 NOTE — Progress Notes (Signed)
The patient is 2 weeks tomorrow status post a left total hip arthroplasty.  She is very pleased with her hip and says she is doing great with range of motion and strength.  She has been on a baby aspirin twice a day.  She will go down to once a day for a week and then can stop this.  Notes from home therapy says she is doing excellent.  She is using a cane but not when she is at home.  On exam her left hip incision looks great.  Remove the staples and placed Steri-Strips.  Her leg lengths show that she is actually slightly longer on her operative side.  She feels that there is a little difference.  She is going to try just over-the-counter inserts in one shoe only for now.  She is very pleased overall.  I would like to see her back in 4 weeks to see how she is doing overall and to get a good exam again with her laying supine to test her leg lengths.  We still need to x-ray her for 6 months and she understands this.  All question concerns were answered and addressed.

## 2018-09-15 ENCOUNTER — Emergency Department (HOSPITAL_BASED_OUTPATIENT_CLINIC_OR_DEPARTMENT_OTHER)
Admission: EM | Admit: 2018-09-15 | Discharge: 2018-09-15 | Disposition: A | Discharge status: Home / Self Care | Payer: Medicare Other | Attending: Emergency Medicine | Admitting: Emergency Medicine

## 2018-09-15 ENCOUNTER — Encounter (HOSPITAL_BASED_OUTPATIENT_CLINIC_OR_DEPARTMENT_OTHER): Payer: Self-pay

## 2018-09-15 ENCOUNTER — Emergency Department (HOSPITAL_BASED_OUTPATIENT_CLINIC_OR_DEPARTMENT_OTHER): Payer: Medicare Other

## 2018-09-15 ENCOUNTER — Other Ambulatory Visit: Payer: Self-pay

## 2018-09-15 DIAGNOSIS — S3992XA Unspecified injury of lower back, initial encounter: Secondary | ICD-10-CM | POA: Diagnosis present

## 2018-09-15 DIAGNOSIS — Z96642 Presence of left artificial hip joint: Secondary | ICD-10-CM | POA: Diagnosis not present

## 2018-09-15 DIAGNOSIS — Y9389 Activity, other specified: Secondary | ICD-10-CM | POA: Insufficient documentation

## 2018-09-15 DIAGNOSIS — Z79899 Other long term (current) drug therapy: Secondary | ICD-10-CM | POA: Diagnosis not present

## 2018-09-15 DIAGNOSIS — S39012A Strain of muscle, fascia and tendon of lower back, initial encounter: Secondary | ICD-10-CM | POA: Insufficient documentation

## 2018-09-15 DIAGNOSIS — Y999 Unspecified external cause status: Secondary | ICD-10-CM | POA: Insufficient documentation

## 2018-09-15 DIAGNOSIS — Z7982 Long term (current) use of aspirin: Secondary | ICD-10-CM | POA: Diagnosis not present

## 2018-09-15 DIAGNOSIS — Y92410 Unspecified street and highway as the place of occurrence of the external cause: Secondary | ICD-10-CM | POA: Insufficient documentation

## 2018-09-15 MED ORDER — CYCLOBENZAPRINE HCL 10 MG PO TABS: 10.0000 mg | ORAL_TABLET | Freq: Two times a day (BID) | ORAL | 0 refills | Status: DC | PRN

## 2018-09-15 NOTE — ED Notes (Signed)
Pt left before receiving discharge instructions.

## 2018-09-15 NOTE — ED Notes (Signed)
Pt enrolled in aromatherapy pain trial 

## 2018-09-15 NOTE — ED Notes (Signed)
Pt in Radiology 
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
## 2018-09-15 NOTE — Discharge Instructions (Signed)
Use Aleve or Tylenol as needed for pain as well as heating pad or hot showers.  Use the muscle relaxer as needed for spasm.

## 2018-09-21 ENCOUNTER — Encounter (INDEPENDENT_AMBULATORY_CARE_PROVIDER_SITE_OTHER): Payer: Self-pay | Admitting: Orthopaedic Surgery

## 2018-09-21 ENCOUNTER — Ambulatory Visit (INDEPENDENT_AMBULATORY_CARE_PROVIDER_SITE_OTHER): Payer: Medicare Other | Admitting: Orthopaedic Surgery

## 2018-09-21 DIAGNOSIS — Z96642 Presence of left artificial hip joint: Secondary | ICD-10-CM

## 2018-09-21 MED ORDER — MELOXICAM 15 MG PO TABS: 15.0000 mg | ORAL_TABLET | Freq: Every day | ORAL | 6 refills | Status: DC | PRN
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
## 2018-10-08 ENCOUNTER — Ambulatory Visit (INDEPENDENT_AMBULATORY_CARE_PROVIDER_SITE_OTHER): Payer: Medicare Other | Admitting: Physician Assistant

## 2018-10-08 ENCOUNTER — Encounter (INDEPENDENT_AMBULATORY_CARE_PROVIDER_SITE_OTHER): Payer: Self-pay | Admitting: Physician Assistant

## 2018-10-08 DIAGNOSIS — M5442 Lumbago with sciatica, left side: Secondary | ICD-10-CM
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
## 2019-02-27 IMAGING — DX DG PORTABLE PELVIS
1 series · 1 of 1 positions shown · non-contrast
Comparison: 08/07/2018.

CLINICAL DATA: Left hip replacement.

EXAM:
PORTABLE PELVIS 1-2 VIEWS

[pelvis lat]
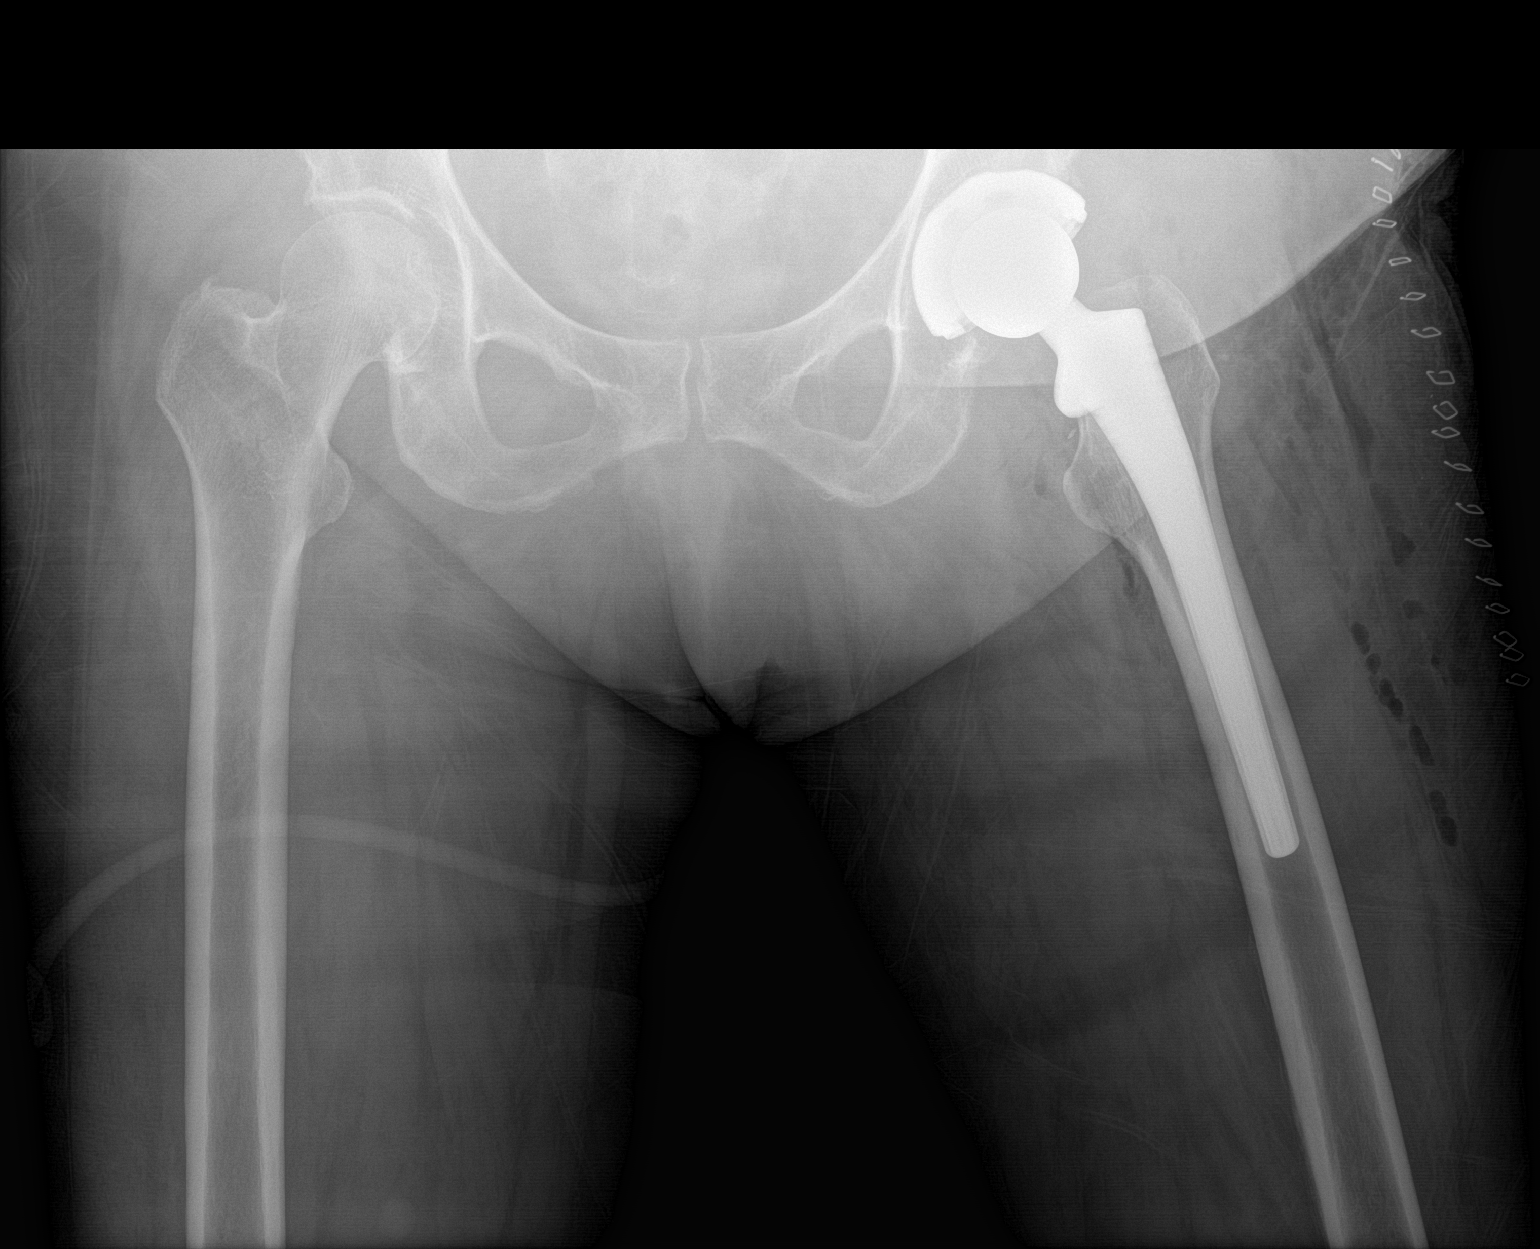

[1 of 1 positions shown; findings below may reference images not displayed]

FINDINGS: Total left hip replacement. Anatomic alignment. Hardware intact. No
acute bony abnormality.
IMPRESSION: Total left hip replacement with anatomic alignment.

## 2019-02-27 IMAGING — RF DG HIP (WITH PELVIS) OPERATIVE*L*
1 series · 2 of 2 positions shown · non-contrast
Comparison: 06/28/2015

CLINICAL DATA: Hip pain.

EXAM:
OPERATIVE LEFT HIP (WITH PELVIS IF PERFORMED) 2 VIEWS
TECHNIQUE: Fluoroscopic spot image(s) were submitted for interpretation
post-operatively.

[Series 1: unknown protocol · 0.20mm/px · 2 of 2 slices shown]
[im 1/2]
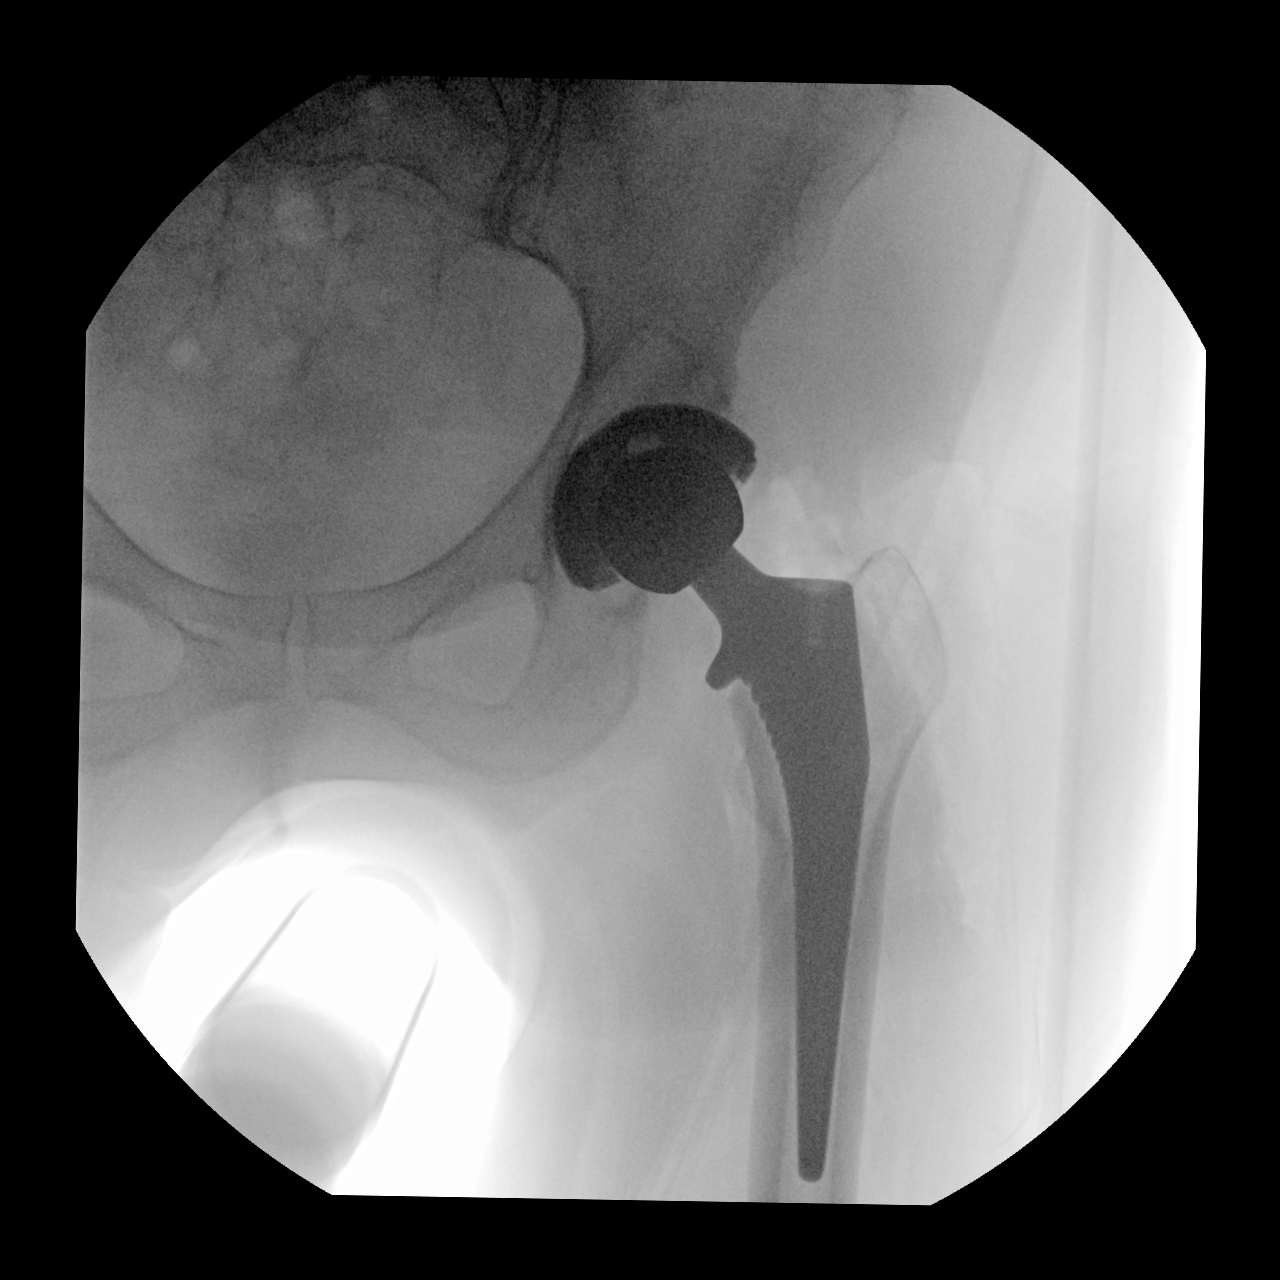
[im 2/2]
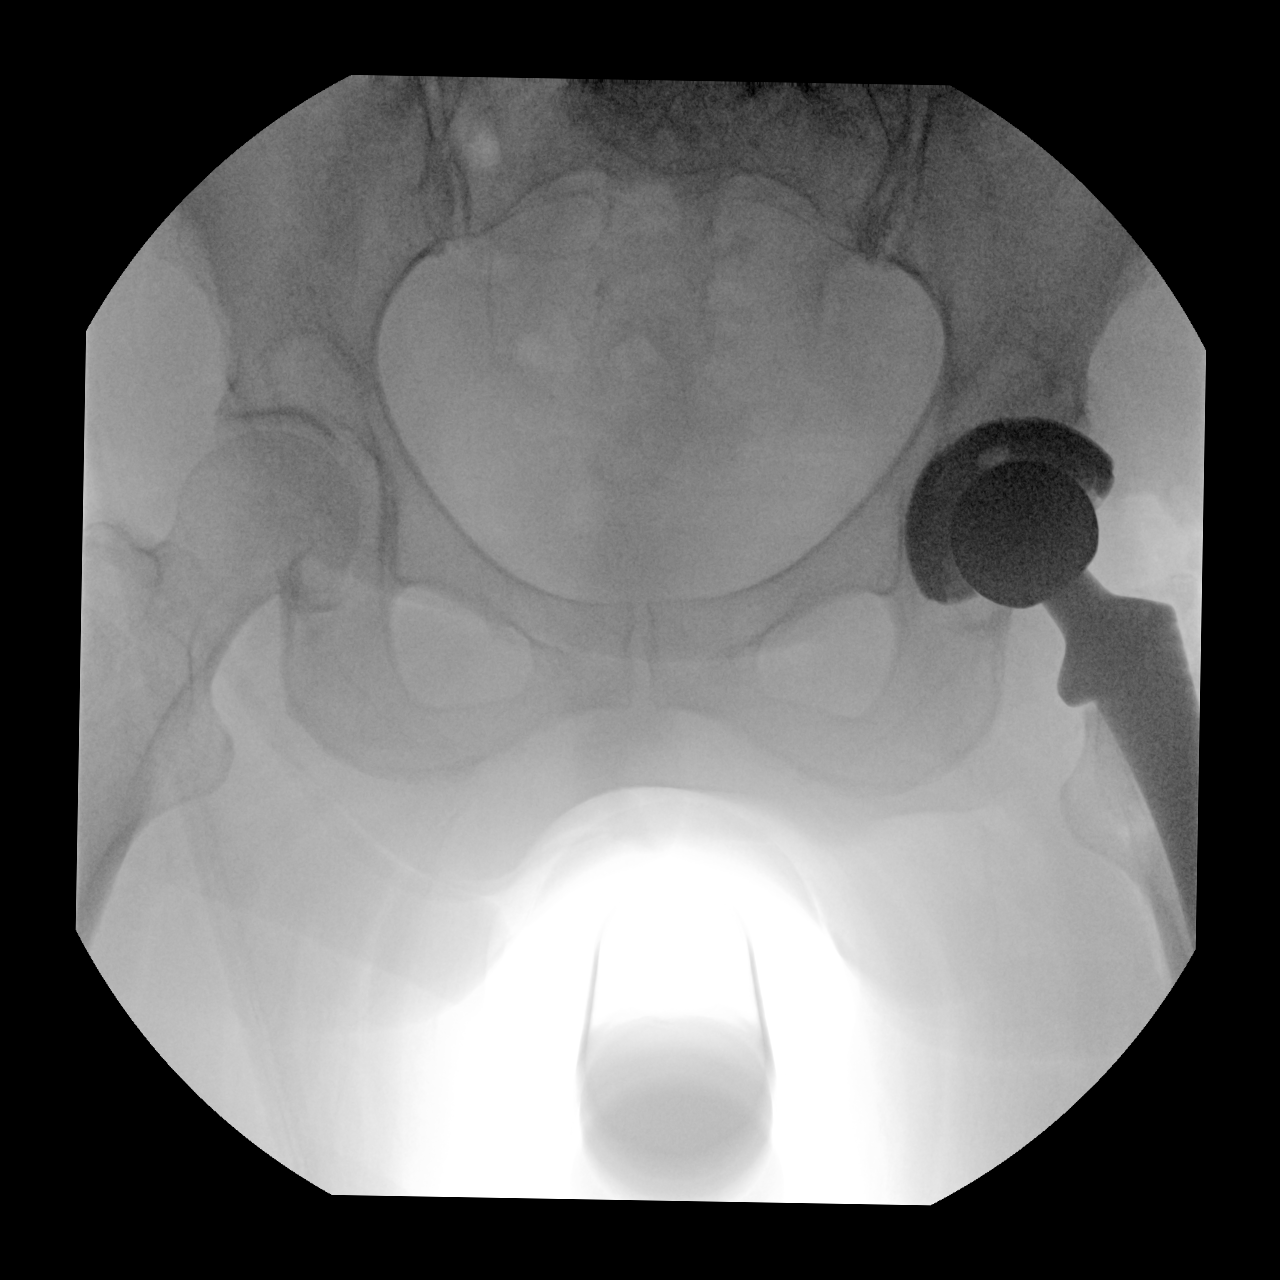

[2 of 2 positions shown; findings below may reference images not displayed]

FINDINGS: LEFT anterior total hip replacement.  No adverse features.
IMPRESSION: Satisfactory position and alignment.

## 2019-03-22 ENCOUNTER — Ambulatory Visit: Payer: Self-pay | Admitting: Orthopaedic Surgery

## 2019-09-13 ENCOUNTER — Other Ambulatory Visit (INDEPENDENT_AMBULATORY_CARE_PROVIDER_SITE_OTHER): Payer: Self-pay | Admitting: Orthopaedic Surgery

## 2019-12-29 ENCOUNTER — Ambulatory Visit (INDEPENDENT_AMBULATORY_CARE_PROVIDER_SITE_OTHER): Payer: PRIVATE HEALTH INSURANCE

## 2019-12-29 ENCOUNTER — Other Ambulatory Visit: Payer: Self-pay

## 2019-12-29 ENCOUNTER — Ambulatory Visit (INDEPENDENT_AMBULATORY_CARE_PROVIDER_SITE_OTHER): Payer: Medicare Other | Admitting: Orthopaedic Surgery

## 2019-12-29 VITALS — Ht 59.0 in | Wt 157.0 lb

## 2019-12-29 DIAGNOSIS — M25551 Pain in right hip: Secondary | ICD-10-CM | POA: Diagnosis not present

## 2019-12-29 NOTE — Progress Notes (Signed)
Office Visit Note   Patient: Brenda Mccall           Date of Birth: 08-22-1951           MRN: [id number] Visit Date: 12/29/2019              Requested by: Ardith Dark, PA-C [address] [place],  [place] [address] PCP: Ardith Dark, PA-C   Assessment & Plan: Visit Diagnoses:  1. Pain in right hip     Plan: She was pleased to see that her right hip x-rays do not look like her hip did when she needed hip replacement on the left side.  The joint space is well-maintained.  This is more of a soft tissue issue around the trochanteric area.  I showed her stretching exercises to try.  She will also try Voltaren gel.  She is satisfied with seeing her x-rays to the point that she states that she does not feel like she needs a steroid injection but I offered that for her and I told her that if her right hip continues to bother her that that was my neck step is a steroid injection and formal physical therapy.  All question concerns were answered and addressed.  Follow-Up Instructions: Return if symptoms worsen or fail to improve.   Orders:  Orders Placed This Encounter  Procedures  . XR HIP UNILAT W OR W/O PELVIS 1V RIGHT   No orders of the defined types were placed in this encounter.     Procedures: No procedures performed   Clinical Data: No additional findings.   Subjective: Chief Complaint  Patient presents with  . Right Hip - Pain  The patient is a 69 year old female well-known to me.  She has a history of a left total hip arthroplasty to be performed in 2019.  She had had terrible hip pain that led her to that surgery.  Her right hip is been hurting for about 3 weeks now but this occurred after she was mowing the yard.  She does have some groin pain but relates pain on the lateral aspect of her right hip that is worrisome for her.  She states that she did not want to let this go long given how severe her arthritis and pain was on her left hip and how  long she let that go.  She is not a diabetic.  She is not needing to walk with a cane.  She denies any numbness and tingling in her feet and denies any back pain  HPI  Review of Systems She currently denies any headache, chest pain, shortness of breath, fever, chills, nausea, vomiting  Objective: Vital Signs: Ht 4\' 11"  (1.499 m)   Wt 157 lb (71.2 kg)   BMI 31.71 kg/m   Physical Exam She is alert and orient x3 and in no acute distress Ortho Exam Examination of her right hip shows fluid and full range of motion but severe pain of the trochanteric area.  This is pain with palpation as well.  Her left hip exam is normal.  The remainder of her right lower extremity exam is normal. Specialty Comments:  No specialty comments available.  Imaging: XR HIP UNILAT W OR W/O PELVIS 1V RIGHT  Result Date: 12/29/2019 An AP pelvis and lateral the right hip shows a well-maintained hip joint with no acute findings.  There is a small osteophyte over the trochanteric area.  The left total hip arthroplasty appears in a good position  with no complicating features.    PMFS History: Patient Active Problem List   Diagnosis Date Noted  . Status post total replacement of left hip 08/07/2018  . Unilateral primary osteoarthritis, left hip 06/08/2018   Past Medical History:  Diagnosis Date  . Arthritis   . History of kidney stones   . Kidney stones   . Renal disorder     No family history on file.  Past Surgical History:  Procedure Laterality Date  . APPENDECTOMY    . BACK SURGERY  11/2017   Discectomy  . BELPHAROPTOSIS REPAIR    . CARPAL TUNNEL RELEASE Bilateral 2000  . CESAREAN SECTION  1980  . CHOLECYSTECTOMY  1981  . COLONOSCOPY  2014  . EYE SURGERY Bilateral    Cataract  . KIDNEY STONE SURGERY    . NISSEN FUNDOPLICATION  2004  . TOTAL HIP ARTHROPLASTY Left 08/07/2018   Procedure: LEFT TOTAL HIP ARTHROPLASTY ANTERIOR APPROACH;  Surgeon: Kathryne Hitch, MD;  Location: [facility];   Service: Orthopedics;  Laterality: Left;   Social History   Occupational History  . Not on file  Tobacco Use  . Smoking status: Never Smoker  . Smokeless tobacco: Never Used  Substance and Sexual Activity  . Alcohol use: Yes    Alcohol/week: 1.0 standard drinks    Types: 1 Glasses of wine per week    Comment: daily  . Drug use: Never  . Sexual activity: Not on file

## 2021-04-04 ENCOUNTER — Encounter: Payer: Self-pay | Admitting: Orthopaedic Surgery

## 2021-04-04 ENCOUNTER — Ambulatory Visit (INDEPENDENT_AMBULATORY_CARE_PROVIDER_SITE_OTHER): Payer: Medicare Other

## 2021-04-04 ENCOUNTER — Ambulatory Visit (INDEPENDENT_AMBULATORY_CARE_PROVIDER_SITE_OTHER): Payer: Medicare Other | Admitting: Orthopaedic Surgery

## 2021-04-04 ENCOUNTER — Other Ambulatory Visit: Payer: Self-pay

## 2021-04-04 VITALS — Ht 59.0 in | Wt 160.0 lb

## 2021-04-04 DIAGNOSIS — M545 Low back pain, unspecified: Secondary | ICD-10-CM

## 2021-04-04 DIAGNOSIS — M25552 Pain in left hip: Secondary | ICD-10-CM

## 2021-04-04 MED ORDER — METHYLPREDNISOLONE 4 MG PO TABS: ORAL_TABLET | ORAL | 0 refills | Status: DC

## 2021-04-04 NOTE — Progress Notes (Signed)
Office Visit Note   Patient: Brenda Mccall           Date of Birth: September 05, 1951           MRN: [id number] Visit Date: 04/04/2021              Requested by: Jamal Collin, PA-C [address] [place],  [place] [address] PCP: Jamal Collin, PA-C   Assessment & Plan: Visit Diagnoses:  1. Pain in left hip   2. Acute bilateral low back pain, unspecified whether sciatica present     Plan: I do believe this is a spine issue and not a hip issue based on my clinical exam.  A MRI with contrast is warranted lumbar spine given her previous disc surgery and laminectomy.  We will start her on a 6-day steroid taper in the interim while we await this MRI.  All question concerns were answered and addressed.  Follow-Up Instructions: No follow-ups on file.   Orders:  Orders Placed This Encounter  Procedures   XR HIP UNILAT W OR W/O PELVIS 2-3 VIEWS LEFT   XR Lumbar Spine 2-3 Views   Meds ordered this encounter  Medications   methylPREDNISolone (MEDROL) 4 MG tablet    Sig: Medrol dose pack. Take as instructed    Dispense:  21 tablet    Refill:  0      Procedures: No procedures performed   Clinical Data: No additional findings.   Subjective: Chief Complaint  Patient presents with   Left Hip - Pain   Lower Back - Pain   Right Hip - Pain  The patient comes in today with worsening right-sided sciatica and low back pain.  She is getting numbness and tingling going down her leg into her right foot.  She has an electric shock type of feeling as well.  This started about 6 weeks ago and is getting significantly worse.  I actually replaced her left hip in 2019.  She had lumbar spine surgery with a discectomy and laminectomy by Dr. Newell Coral in March 2019.  He has since retired.  She denies any right hip and groin pain.  She says her left hip is been doing well.  She has been working on activity modification and using ice and a TENS unit and taking extra arthritic  over-the-counter pain medications as well.  She denies any change in bowel or bladder function.  She is not a diabetic.  She has not morbidly obese.  HPI  Review of Systems She currently denies any headache, chest pain, shortness of breath, fever, chills, nausea, vomiting  Objective: Vital Signs: Ht 4\' 11"  (1.499 m)   Wt 160 lb (72.6 kg)   BMI 32.32 kg/m   Physical Exam She is alert and orient x3 and in no acute distress Ortho Exam Examination of her right hip shows it moves smoothly and fluidly with no pain in the groin at all.  Her left operative hip also moves smoothly and fluidly.  She does have significant pain in the sciatic region on the right side.  She has a positive straight leg raise on the right side and numbness in the L4 and L5 distribution on the right side.  Her reflexes are equal.  She has positive pain on stretch with sciatic nerve and pain with flexion and extension of the lumbar spine. Specialty Comments:  No specialty comments available.  Imaging: XR HIP UNILAT W OR W/O PELVIS 2-3 VIEWS LEFT  Result Date: 04/04/2021  An AP pelvis and lateral of the hip shows moderate arthritic changes with joint space narrowing of the right hip that is increased since 2021.  The left hip replacement appears normal.  XR Lumbar Spine 2-3 Views  Result Date: 04/04/2021 2 views of the lumbar spine show no acute findings.  There is significant degenerative changes between L4 and L5 and L5 and S1.    PMFS History: Patient Active Problem List   Diagnosis Date Noted   Status post total replacement of left hip 08/07/2018   Unilateral primary osteoarthritis, left hip 06/08/2018   Past Medical History:  Diagnosis Date   Arthritis    History of kidney stones    Kidney stones    Renal disorder     No family history on file.  Past Surgical History:  Procedure Laterality Date   APPENDECTOMY     BACK SURGERY  11/2017   Discectomy   BELPHAROPTOSIS REPAIR     CARPAL TUNNEL RELEASE  Bilateral 2000   CESAREAN SECTION  1980   CHOLECYSTECTOMY  1981   COLONOSCOPY  2014   EYE SURGERY Bilateral    Cataract   KIDNEY STONE SURGERY     NISSEN FUNDOPLICATION  2004   TOTAL HIP ARTHROPLASTY Left 08/07/2018   Procedure: LEFT TOTAL HIP ARTHROPLASTY ANTERIOR APPROACH;  Surgeon: Kathryne Hitch, MD;  Location: [facility];  Service: Orthopedics;  Laterality: Left;   Social History   Occupational History   Not on file  Tobacco Use   Smoking status: Never   Smokeless tobacco: Never  Vaping Use   Vaping Use: Never used  Substance and Sexual Activity   Alcohol use: Yes    Alcohol/week: 1.0 standard drink    Types: 1 Glasses of wine per week    Comment: daily   Drug use: Never   Sexual activity: Not on file

## 2021-04-04 NOTE — Addendum Note (Signed)
Addended by: Rogers Seeds on: 04/04/2021 03:38 PM   Modules accepted: Orders

## 2021-04-10 ENCOUNTER — Encounter: Payer: Self-pay | Admitting: Orthopaedic Surgery

## 2021-04-10 NOTE — Telephone Encounter (Signed)
Can you please check on the status of pt being scheduled for MRI?

## 2021-04-11 ENCOUNTER — Other Ambulatory Visit: Payer: Self-pay | Admitting: Orthopaedic Surgery

## 2021-04-11 MED ORDER — TRAMADOL HCL 50 MG PO TABS: 100.0000 mg | ORAL_TABLET | Freq: Four times a day (QID) | ORAL | 0 refills | Status: DC | PRN

## 2021-04-20 ENCOUNTER — Ambulatory Visit
Admission: RE | Admit: 2021-04-20 | Discharge: 2021-04-20 | Disposition: A | Discharge status: Home / Self Care | Payer: Medicare Other | Source: Ambulatory Visit | Attending: Orthopaedic Surgery | Admitting: Orthopaedic Surgery

## 2021-04-20 DIAGNOSIS — M545 Low back pain, unspecified: Secondary | ICD-10-CM

## 2021-04-20 MED ORDER — GADOBENATE DIMEGLUMINE 529 MG/ML IV SOLN
15.0000 mL | Freq: Once | INTRAVENOUS | Status: AC | PRN
  Administered 2021-04-20: 15 mL via INTRAVENOUS

## 2021-07-27 ENCOUNTER — Telehealth: Payer: Self-pay | Admitting: Orthopaedic Surgery

## 2021-07-27 NOTE — Telephone Encounter (Signed)
Received vm from patient requesting records be faxed to [facility]. IC,lmvm advised need to sign release form before we can send records.

## 2021-07-27 NOTE — Telephone Encounter (Signed)
VOID PREVIOUS TELEPHONE NOTE/MADE IN ERROR * NOTES ON [facility] PATIENT NOT FAXED AS NOTED* VOID NOTATION*

## 2021-07-27 NOTE — Telephone Encounter (Signed)
Records faxed to PCP Dr. Link Snuffer

## 2021-07-30 ENCOUNTER — Telehealth: Payer: Self-pay | Admitting: Orthopaedic Surgery

## 2021-07-30 NOTE — Telephone Encounter (Signed)
Received vm from pt asking if she can pick up records when she comes in to sign release form. IC,lmvm advised copy of records will be ready when comes in to sign release on Thursday.

## 2021-10-15 ENCOUNTER — Other Ambulatory Visit: Payer: Self-pay

## 2021-10-15 ENCOUNTER — Ambulatory Visit (INDEPENDENT_AMBULATORY_CARE_PROVIDER_SITE_OTHER): Payer: Medicare Other | Admitting: Orthopaedic Surgery

## 2021-10-15 DIAGNOSIS — M25551 Pain in right hip: Secondary | ICD-10-CM | POA: Diagnosis not present

## 2021-10-15 DIAGNOSIS — M1611 Unilateral primary osteoarthritis, right hip: Secondary | ICD-10-CM | POA: Insufficient documentation

## 2021-10-15 NOTE — Progress Notes (Addendum)
The patient is well-known to me.  We have been following her for right hip arthritis for some time now and is gotten significantly worse.  She is having more more groin pain and is detriment affecting her mobility, her quality of life and actives daily living.  We actually replaced her left hip successfully in 2019 and that has done well for her.  I have x-rayed her pelvis and right hip recently showing significant joint space narrowing and periarticular osteophytes.  She did have a steroid injection in her right hip under direct fluoroscopy on January 18.  She said for few days this helped a great but now her pain is back.  At this point her pain can be 10 out of 10 and she is interested in having her right hip replaced given the success in her left hip replacement and combined with her clinical exam findings and radiographic findings with her right hip.  She is not a diabetic.  She is had no other acute change in her medical status.  She has been dealing with this pain for now over 12 months and is failed all forms of conservative treatment.  Examination of her left operative hip shows it moves smoothly and fluidly.  This hip was replaced again in 2019.  Her right hip has significant pain with internal and external rotation as well as stiffness with rotation.  Her right hip on x-ray shows significant arthritis with joint space narrowing and peritubular osteophytes.  Her left hip replacement looks good.   At this point we will work on getting her scheduled for a right total hip arthroplasty.  Having had this before she is fully aware of the risk and benefits of surgery and what to expect with an intraoperative and postoperative course.  She knows our surgery scheduler will be in touch.  All questions and concerns were answered and addressed.

## 2021-12-03 NOTE — Progress Notes (Signed)
Sent message, via epic in basket, requesting orders in epic from surgeon.  

## 2021-12-04 ENCOUNTER — Other Ambulatory Visit: Payer: Self-pay | Admitting: Physician Assistant

## 2021-12-04 DIAGNOSIS — M1611 Unilateral primary osteoarthritis, right hip: Secondary | ICD-10-CM

## 2021-12-07 NOTE — Patient Instructions (Addendum)
2 VISITORS  (aged 69 and older)  IS ALLOWED TO COME WITH YOU AND STAY IN THE WAITING ROOM ONLY DURING PRE OP AND PROCEDURE.  ?  ?**NO VISITORS ARE ALLOWED IN THE SHORT STAY AREA OR RECOVERY ROOM!!** ? ?IF YOU WILL BE ADMITTED INTO THE HOSPITAL YOU ARE ALLOWED ONLY 4 SUPPORT PEOPLE DURING VISITATION HOURS ONLY (7 AM -8PM)   ?The support person(s) must pass our screening, gel in and out, and wear a mask at all times, including in the patient?s room. ?Patients must also wear a mask when staff or their support person are in the room. ?Visitors GUEST BADGE MUST BE WORN VISIBLY  ?One adult visitor may remain with you overnight and MUST be in the room by 8 P.M. ?  ? ? Your procedure is scheduled on: 12/21/21 ? ? Report to [facility] Main Entrance ? ?  Report to admitting at 8:00 AM ? ? Call this number if you have problems the morning of surgery [phone] ? ? Do not eat food :After Midnight. ? ? After Midnight you may have the following liquids until 7:30__ AM DAY OF SURGERY ? ?Water ?Black Coffee (sugar ok, NO MILK/CREAM OR CREAMERS)  ?Tea (sugar ok, NO MILK/CREAM OR CREAMERS) regular and decaf                             ?Plain Jell-O (NO RED)                                           ?Fruit ices (not with fruit pulp, NO RED)                                     ?Popsicles (NO RED)                                                                  ?Juice: apple, WHITE grape, WHITE cranberry ?Sports drinks like Gatorade (NO RED) ?Clear broth(vegetable,chicken,beef) ? ?             ? ?  ?  ?The day of surgery:  ?Drink ONE (1) Pre-Surgery Clear Ensure  at 7:16 AM the morning of surgery. Drink in one sitting. Do not sip.  ?This drink was given to you during your hospital  ?pre-op appointment visit. ?Nothing else to drink after completing the  ?Pre-Surgery Clear Ensure at 7:30 AM ?  ?       If you have questions, please contact your surgeon?s office. ? ? ?  ?  ?Oral Hygiene is also important to reduce your risk of  infection.                                    ?Remember - BRUSH YOUR TEETH THE MORNING OF SURGERY WITH YOUR REGULAR TOOTHPASTE ? ? Do NOT smoke after Midnight ? ? Take these medicines the morning of surgery with A SIP OF WATER: Paxil ? ?. ?                  ?  You may not have any metal on your body including hair pins, jewelry, and body piercing ? ?           Do not wear make-up, lotions, powders, perfumes/cologne, or deodorant ? ?Do not wear nail polish including gel and S&S, artificial/acrylic nails, or any other type of covering on natural nails including finger and toenails. If you have artificial nails, gel coating, etc. that needs to be removed by a nail salon please have this removed prior to surgery or surgery may need to be canceled/ delayed if the surgeon/ anesthesia feels like they are unable to be safely monitored.  ? ?Do not shave  48 hours prior to surgery.  ? ? ? Do not bring valuables to the hospital. [facility] IS NOT ?            RESPONSIBLE   FOR VALUABLES. ? ? Contacts, dentures or bridgework may not be worn into surgery. ? ? Bring small overnight bag day of surgery. ?  ? Patients discharged on the day of surgery will not be allowed to drive home.  Someone NEEDS to stay with you for the first 24 hours after anesthesia. ? ? Special Instructions: Bring a copy of your healthcare power of attorney and living will documents the day of surgery if you haven't scanned them before. ? ?            Please read over the following fact sheets you were given: IF YOU HAVE QUESTIONS ABOUT YOUR PRE-OP INSTRUCTIONS PLEASE CALL [phone] ? ?   [facility] - Preparing for Surgery ?Before surgery, you can play an important role.  Because skin is not sterile, your skin needs to be as free of germs as possible.  You can reduce the number of germs on your skin by washing with CHG (chlorahexidine gluconate) soap before surgery.  CHG is an antiseptic cleaner which kills germs and bonds with the skin to  continue killing germs even after washing. ?Please DO NOT use if you have an allergy to CHG or antibacterial soaps.  If your skin becomes reddened/irritated stop using the CHG and inform your nurse when you arrive at Short Stay. ?Do not shave (including legs and underarms) for at least 48 hours prior to the first CHG shower.   ?Please follow these instructions carefully: ? 1.  Shower with CHG Soap the night before surgery and the  morning of Surgery. ? 2.  If you choose to wash your hair, wash your hair first as usual with your  normal  shampoo. ? 3.  After you shampoo, rinse your hair and body thoroughly to remove the  shampoo.                     ?       4.  Use CHG as you would any other liquid soap.  You can apply chg directly  to the skin and wash  ?                     Gently with a scrungie or clean washcloth. ? 5.  Apply the CHG Soap to your body ONLY FROM THE NECK DOWN.   Do not use on face/ open      ?                     Wound or open sores. Avoid contact with eyes, ears mouth and genitals (private parts).  ?  Wash face,  Genitals (private parts) with your normal soap. ?            6.  Wash thoroughly, paying special attention to the area where your surgery  will be performed. ? 7.  Thoroughly rinse your body with warm water from the neck down. ? 8.  DO NOT shower/wash with your normal soap after using and rinsing off  the CHG Soap. ?               9.  Pat yourself dry with a clean towel. ?           10.  Wear clean pajamas. ?           11.  Place clean sheets on your bed the night of your first shower and do not  sleep with pets. ?Day of Surgery : ?Do not apply any lotions/deodorants the morning of surgery.  Please wear clean clothes to the hospital/surgery center. ? ?FAILURE TO FOLLOW THESE INSTRUCTIONS MAY RESULT IN THE CANCELLATION OF YOUR SURGERY ? ? ? ?________________________________________________________________________  ? ?Incentive Spirometer ? ?An incentive spirometer is a  tool that can help keep your lungs clear and active. This tool measures how well you are filling your lungs with each breath. Taking long deep breaths may help reverse or decrease the chance of developing breathing (pulmonary) problems (especially infection) following: ?A long period of time when you are unable to move or be active. ?BEFORE THE PROCEDURE  ?If the spirometer includes an indicator to show your best effort, your nurse or respiratory therapist will set it to a desired goal. ?If possible, sit up straight or lean slightly forward. Try not to slouch. ?Hold the incentive spirometer in an upright position. ?INSTRUCTIONS FOR USE  ?Sit on the edge of your bed if possible, or sit up as far as you can in bed or on a chair. ?Hold the incentive spirometer in an upright position. ?Breathe out normally. ?Place the mouthpiece in your mouth and seal your lips tightly around it. ?Breathe in slowly and as deeply as possible, raising the piston or the ball toward the top of the column. ?Hold your breath for 3-5 seconds or for as long as possible. Allow the piston or ball to fall to the bottom of the column. ?Remove the mouthpiece from your mouth and breathe out normally. ?Rest for a few seconds and repeat Steps 1 through 7 at least 10 times every 1-2 hours when you are awake. Take your time and take a few normal breaths between deep breaths. ?The spirometer may include an indicator to show your best effort. Use the indicator as a goal to work toward during each repetition. ?After each set of 10 deep breaths, practice coughing to be sure your lungs are clear. If you have an incision (the cut made at the time of surgery), support your incision when coughing by placing a pillow or rolled up towels firmly against it. ?Once you are able to get out of bed, walk around indoors and cough well. You may stop using the incentive spirometer when instructed by your caregiver.  ?RISKS AND COMPLICATIONS ?Take your time so you do not  get dizzy or light-headed. ?If you are in pain, you may need to take or ask for pain medication before doing incentive spirometry. It is harder to take a deep breath if you are having pain. ?AFTER USE ?Rest and

## 2021-12-10 ENCOUNTER — Other Ambulatory Visit: Payer: Self-pay

## 2021-12-10 ENCOUNTER — Encounter (HOSPITAL_COMMUNITY): Payer: Self-pay

## 2021-12-10 ENCOUNTER — Encounter (HOSPITAL_COMMUNITY)
Admission: RE | Admit: 2021-12-10 | Discharge: 2021-12-10 | Disposition: A | Discharge status: Home / Self Care | Payer: Medicare Other | Source: Ambulatory Visit | Attending: Orthopaedic Surgery | Admitting: Orthopaedic Surgery

## 2021-12-10 VITALS — BP 168/82 | HR 72 | Temp 98.9°F | Resp 16 | Ht 59.0 in | Wt 153.0 lb

## 2021-12-10 DIAGNOSIS — Z01812 Encounter for preprocedural laboratory examination: Secondary | ICD-10-CM | POA: Diagnosis not present

## 2021-12-10 DIAGNOSIS — Z01818 Encounter for other preprocedural examination: Secondary | ICD-10-CM

## 2021-12-10 DIAGNOSIS — M1611 Unilateral primary osteoarthritis, right hip: Secondary | ICD-10-CM | POA: Insufficient documentation

## 2021-12-10 HISTORY — DX: Essential (primary) hypertension: I10

## 2021-12-10 LAB — SURGICAL PCR SCREEN
MRSA, PCR: NEGATIVE
Staphylococcus aureus: NEGATIVE

## 2021-12-10 LAB — BASIC METABOLIC PANEL
Anion gap: 8 (ref 5–15)
BUN: 23 mg/dL (ref 8–23)
CO2: 26 mmol/L (ref 22–32)
Calcium: 9.6 mg/dL (ref 8.9–10.3)
Chloride: 103 mmol/L (ref 98–111)
Creatinine, Ser: 1.07 mg/dL — ABNORMAL HIGH (ref 0.44–1.00)
GFR, Estimated: 57 mL/min — ABNORMAL LOW (ref >60)
Glucose, Bld: 118 mg/dL — ABNORMAL HIGH (ref 70–99)
Potassium: 5.2 mmol/L — ABNORMAL HIGH (ref 3.5–5.1)
Sodium: 137 mmol/L (ref 135–145)

## 2021-12-10 LAB — TYPE AND SCREEN
ABO/RH(D): O POS
Antibody Screen: NEGATIVE

## 2021-12-10 LAB — CBC
HCT: 37.8 % (ref 36.0–46.0)
Hemoglobin: 12.3 g/dL (ref 12.0–15.0)
MCH: 31.9 pg (ref 26.0–34.0)
MCHC: 32.5 g/dL (ref 30.0–36.0)
MCV: 98.2 fL (ref 80.0–100.0)
Platelets: 259 10*3/uL (ref 150–400)
RBC: 3.85 MIL/uL — ABNORMAL LOW (ref 3.87–5.11)
RDW: 13.7 % (ref 11.5–15.5)
WBC: 8.3 10*3/uL (ref 4.0–10.5)
nRBC: 0 % (ref 0.0–0.2)

## 2021-12-10 NOTE — Progress Notes (Signed)
Anesthesia note: ? ?Bowel prep reminder:NA ? ?PCP - Tonny Bollman ?Cardiologist -no ?Other-  ? ?Chest x-ray - no ?EKG - no ?Stress Test - no ?ECHO - no ?Cardiac Cath - no ? ?Pacemaker/ICD device last checked:NA ? ?Sleep Study - no ?CPAP -  ? ?Pt is pre diabetic-No ?Fasting Blood Sugar -  ?Checks Blood Sugar _____ ? ?Blood Thinner:NA ?Blood Thinner Instructions: ?Aspirin Instructions: ?Last Dose: ? ?Anesthesia review: no ? ?Patient denies shortness of breath, fever, cough and chest pain at PAT appointment ?Pt has no SOB with any activities ? ?Patient verbalized understanding of instructions that were given to them at the PAT appointment. Patient was also instructed that they will need to review over the PAT instructions again at home before surgery. yes ?

## 2021-12-20 NOTE — H&P (Signed)
TOTAL HIP ADMISSION H&P ? ?Patient is admitted for right total hip arthroplasty. ? ?Subjective: ? ?Chief Complaint: right hip pain ? ?HPI: Brenda Mccall, 71 y.o. female, has a history of pain and functional disability in the right hip(s) due to arthritis and patient has failed non-surgical conservative treatments for greater than 12 weeks to include NSAID's and/or analgesics, corticosteriod injections, use of assistive devices, and activity modification.  Onset of symptoms was gradual starting 1 years ago with gradually worsening course since that time.The patient noted no past surgery on the right hip(s).  Patient currently rates pain in the right hip at 10 out of 10 with activity. Patient has night pain, worsening of pain with activity and weight bearing, pain that interfers with activities of daily living, and pain with passive range of motion. Patient has evidence of subchondral sclerosis, periarticular osteophytes, and joint space narrowing by imaging studies. This condition presents safety issues increasing the risk of falls.  There is no current active infection. ? ?Patient Active Problem List  ? Diagnosis Date Noted  ? Unilateral primary osteoarthritis, right hip 10/15/2021  ? Status post total replacement of left hip 08/07/2018  ? Unilateral primary osteoarthritis, left hip 06/08/2018  ? ?Past Medical History:  ?Diagnosis Date  ? Arthritis   ? History of kidney stones   ? Hypertension   ?  ?Past Surgical History:  ?Procedure Laterality Date  ? APPENDECTOMY    ? [id number]  ? BACK SURGERY  11/2017  ? Discectomy  ? BELPHAROPTOSIS REPAIR    ? CARPAL TUNNEL RELEASE Bilateral 2000  ? CESAREAN SECTION  1980  ? CHOLECYSTECTOMY  1981  ? COLONOSCOPY  2014  ? EYE SURGERY Bilateral   ? Cataract  ? KIDNEY STONE SURGERY    ? x5  ? NISSEN FUNDOPLICATION  2004  ? TOTAL HIP ARTHROPLASTY Left 08/07/2018  ? Procedure: LEFT TOTAL HIP ARTHROPLASTY ANTERIOR APPROACH;  Surgeon: Kathryne Hitch, MD;  Location: [facility];   Service: Orthopedics;  Laterality: Left;  ?  ?No current facility-administered medications for this encounter.  ? ?Current Outpatient Medications  ?Medication Sig Dispense Refill Last Dose  ? meloxicam (MOBIC) 15 MG tablet TAKE 1 TABLET BY MOUTH EVERY DAY AS NEEDED FOR PAIN 30 tablet 6   ? PARoxetine (PAXIL) 10 MG tablet Take 10 mg by mouth every morning.     ? rosuvastatin (CRESTOR) 10 MG tablet Take 10 mg by mouth daily.     ? telmisartan (MICARDIS) 20 MG tablet Take 20 mg by mouth daily.     ? ?Allergies  ?Allergen Reactions  ? Doxycycline Hyclate Hives and Shortness Of Breath  ? Amlodipine Swelling and Other (See Comments)  ?  Leg swelling and jerking ? ?  ? Prednisone   ?  Headache/ Increase blood pressure  ? Flagyl [Metronidazole] Rash  ?  ?Social History  ? ?Tobacco Use  ? Smoking status: Never  ? Smokeless tobacco: Never  ?Substance Use Topics  ? Alcohol use: Yes  ?  Alcohol/week: 3.0 standard drinks  ?  Types: 3 Standard drinks or equivalent per week  ?  ?No family history on file.  ? ?Review of Systems  ?All other systems reviewed and are negative. ? ?Objective: ? ?Physical Exam ?Vitals reviewed.  ?Constitutional:   ?   Appearance: Normal appearance.  ?HENT:  ?   Head: Normocephalic and atraumatic.  ?Eyes:  ?   Extraocular Movements: Extraocular movements intact.  ?   Pupils: Pupils are equal, round, and  reactive to light.  ?Cardiovascular:  ?   Rate and Rhythm: Normal rate and regular rhythm.  ?Pulmonary:  ?   Effort: Pulmonary effort is normal.  ?   Breath sounds: Normal breath sounds.  ?Abdominal:  ?   Palpations: Abdomen is soft.  ?Musculoskeletal:  ?   Cervical back: Normal range of motion and neck supple.  ?   Right hip: Tenderness present. Decreased range of motion. Decreased strength.  ?Neurological:  ?   Mental Status: She is alert and oriented to person, place, and time.  ?Psychiatric:     ?   Behavior: Behavior normal.  ? ? ?Vital signs in last 24 hours: ?  ? ?Labs: ? ? ?Estimated body mass  index is 30.9 kg/m? as calculated from the following: ?  Height as of 12/10/21: 4\' 11"  (1.499 m). ?  Weight as of 12/10/21: 69.4 kg. ? ? ?Imaging Review ?Plain radiographs demonstrate severe degenerative joint disease of the right hip(s). The bone quality appears to be excellent for age and reported activity level. ? ? ? ? ? ?Assessment/Plan: ? ?End stage arthritis, right hip(s) ? ?The patient history, physical examination, clinical judgement of the provider and imaging studies are consistent with end stage degenerative joint disease of the right hip(s) and total hip arthroplasty is deemed medically necessary. The treatment options including medical management, injection therapy, arthroscopy and arthroplasty were discussed at length. The risks and benefits of total hip arthroplasty were presented and reviewed. The risks due to aseptic loosening, infection, stiffness, dislocation/subluxation,  thromboembolic complications and other imponderables were discussed.  The patient acknowledged the explanation, agreed to proceed with the plan and consent was signed. Patient is being admitted for inpatient treatment for surgery, pain control, PT, OT, prophylactic antibiotics, VTE prophylaxis, progressive ambulation and ADL's and discharge planning.The patient is planning to be discharged home with home health services ? ? ? ?

## 2021-12-21 ENCOUNTER — Ambulatory Visit (HOSPITAL_COMMUNITY): Payer: Medicare Other | Admitting: Certified Registered Nurse Anesthetist

## 2021-12-21 ENCOUNTER — Ambulatory Visit (HOSPITAL_COMMUNITY): Payer: Medicare Other

## 2021-12-21 ENCOUNTER — Inpatient Hospital Stay (HOSPITAL_COMMUNITY)
Admission: RE | Admit: 2021-12-21 | Discharge: 2021-12-24 | DRG: 470 | Disposition: A | Discharge status: Home Health | Payer: Medicare Other | Source: Ambulatory Visit | Attending: Orthopaedic Surgery | Admitting: Orthopaedic Surgery

## 2021-12-21 ENCOUNTER — Other Ambulatory Visit: Payer: Self-pay

## 2021-12-21 ENCOUNTER — Encounter (HOSPITAL_COMMUNITY)
Admission: RE | Disposition: A | Discharge status: Home Health | Payer: Self-pay | Source: Ambulatory Visit | Attending: Orthopaedic Surgery

## 2021-12-21 ENCOUNTER — Observation Stay (HOSPITAL_COMMUNITY): Payer: Medicare Other

## 2021-12-21 ENCOUNTER — Encounter (HOSPITAL_COMMUNITY): Payer: Self-pay | Admitting: Orthopaedic Surgery

## 2021-12-21 DIAGNOSIS — I959 Hypotension, unspecified: Secondary | ICD-10-CM | POA: Diagnosis present

## 2021-12-21 DIAGNOSIS — Z881 Allergy status to other antibiotic agents status: Secondary | ICD-10-CM

## 2021-12-21 DIAGNOSIS — Z888 Allergy status to other drugs, medicaments and biological substances status: Secondary | ICD-10-CM

## 2021-12-21 DIAGNOSIS — Z79899 Other long term (current) drug therapy: Secondary | ICD-10-CM

## 2021-12-21 DIAGNOSIS — M1611 Unilateral primary osteoarthritis, right hip: Secondary | ICD-10-CM

## 2021-12-21 DIAGNOSIS — Z96641 Presence of right artificial hip joint: Secondary | ICD-10-CM

## 2021-12-21 DIAGNOSIS — Z96642 Presence of left artificial hip joint: Secondary | ICD-10-CM | POA: Diagnosis present

## 2021-12-21 DIAGNOSIS — D62 Acute posthemorrhagic anemia: Secondary | ICD-10-CM | POA: Diagnosis not present

## 2021-12-21 DIAGNOSIS — I10 Essential (primary) hypertension: Secondary | ICD-10-CM | POA: Diagnosis present

## 2021-12-21 HISTORY — PX: TOTAL HIP ARTHROPLASTY: SHX124

## 2021-12-21 LAB — ABO/RH: ABO/RH(D): O POS

## 2021-12-21 SURGERY — ARTHROPLASTY, HIP, TOTAL, ANTERIOR APPROACH
Anesthesia: Spinal | Site: Hip | Laterality: Right

## 2021-12-21 MED ORDER — HYDROMORPHONE HCL 1 MG/ML IJ SOLN
INTRAMUSCULAR | Status: AC
  Administered 2021-12-21: 0.5 mg via INTRAVENOUS
  Filled 2021-12-21: qty 2

## 2021-12-21 MED ORDER — FENTANYL CITRATE (PF) 100 MCG/2ML IJ SOLN
INTRAMUSCULAR | Status: DC | PRN
  Administered 2021-12-21 (×4): 50 ug via INTRAVENOUS

## 2021-12-21 MED ORDER — FENTANYL CITRATE (PF) 100 MCG/2ML IJ SOLN
INTRAMUSCULAR | Status: AC
  Filled 2021-12-21: qty 2

## 2021-12-21 MED ORDER — FENTANYL CITRATE (PF) 100 MCG/2ML IJ SOLN
INTRAMUSCULAR | Status: AC
Start: 2021-12-21 — End: ?
  Filled 2021-12-21: qty 2

## 2021-12-21 MED ORDER — ACETAMINOPHEN 160 MG/5ML PO SOLN: 325.0000 mg | ORAL | Status: DC | PRN

## 2021-12-21 MED ORDER — IRBESARTAN 75 MG PO TABS
75.0000 mg | ORAL_TABLET | Freq: Every day | ORAL | Status: DC
  Administered 2021-12-21: 75 mg via ORAL
  Filled 2021-12-21 (×4): qty 1

## 2021-12-21 MED ORDER — 0.9 % SODIUM CHLORIDE (POUR BTL) OPTIME
TOPICAL | Status: DC | PRN
  Administered 2021-12-21: 1000 mL

## 2021-12-21 MED ORDER — BUPIVACAINE IN DEXTROSE 0.75-8.25 % IT SOLN
INTRATHECAL | Status: DC | PRN
  Administered 2021-12-21: 1.5 mL via INTRATHECAL

## 2021-12-21 MED ORDER — ACETAMINOPHEN 10 MG/ML IV SOLN
INTRAVENOUS | Status: AC
  Filled 2021-12-21: qty 100

## 2021-12-21 MED ORDER — PROPOFOL 10 MG/ML IV BOLUS
INTRAVENOUS | Status: AC
  Filled 2021-12-21: qty 20

## 2021-12-21 MED ORDER — FENTANYL CITRATE PF 50 MCG/ML IJ SOSY
PREFILLED_SYRINGE | INTRAMUSCULAR | Status: AC
  Administered 2021-12-21: 50 ug via INTRAVENOUS
  Filled 2021-12-21: qty 3

## 2021-12-21 MED ORDER — ONDANSETRON HCL 4 MG/2ML IJ SOLN
INTRAMUSCULAR | Status: DC | PRN
  Administered 2021-12-21: 4 mg via INTRAVENOUS

## 2021-12-21 MED ORDER — ONDANSETRON HCL 4 MG/2ML IJ SOLN: 4.0000 mg | Freq: Once | INTRAMUSCULAR | Status: DC | PRN

## 2021-12-21 MED ORDER — PROPOFOL 500 MG/50ML IV EMUL
INTRAVENOUS | Status: DC | PRN
  Administered 2021-12-21: 75 ug/kg/min via INTRAVENOUS

## 2021-12-21 MED ORDER — METHOCARBAMOL 500 MG PO TABS
500.0000 mg | ORAL_TABLET | Freq: Four times a day (QID) | ORAL | Status: DC | PRN
  Administered 2021-12-21 – 2021-12-24 (×7): 500 mg via ORAL
  Filled 2021-12-21 (×7): qty 1

## 2021-12-21 MED ORDER — SODIUM CHLORIDE 0.9 % IR SOLN
Status: DC | PRN
  Administered 2021-12-21: 1000 mL

## 2021-12-21 MED ORDER — ONDANSETRON HCL 4 MG/2ML IJ SOLN
INTRAMUSCULAR | Status: AC
  Filled 2021-12-21: qty 2

## 2021-12-21 MED ORDER — METOCLOPRAMIDE HCL 5 MG PO TABS: 5.0000 mg | ORAL_TABLET | Freq: Three times a day (TID) | ORAL | Status: DC | PRN

## 2021-12-21 MED ORDER — DIPHENHYDRAMINE HCL 12.5 MG/5ML PO ELIX
12.5000 mg | ORAL_SOLUTION | ORAL | Status: DC | PRN
  Administered 2021-12-22: 25 mg via ORAL
  Filled 2021-12-21: qty 10

## 2021-12-21 MED ORDER — HYDROMORPHONE HCL 1 MG/ML IJ SOLN
0.5000 mg | INTRAMUSCULAR | Status: AC | PRN
  Administered 2021-12-21 (×3): 0.5 mg via INTRAVENOUS

## 2021-12-21 MED ORDER — ONDANSETRON HCL 4 MG/2ML IJ SOLN: 4.0000 mg | Freq: Four times a day (QID) | INTRAMUSCULAR | Status: DC | PRN

## 2021-12-21 MED ORDER — EPHEDRINE SULFATE-NACL 50-0.9 MG/10ML-% IV SOSY
PREFILLED_SYRINGE | INTRAVENOUS | Status: DC | PRN
  Administered 2021-12-21: 10 mg via INTRAVENOUS
  Administered 2021-12-21: 5 mg via INTRAVENOUS
  Administered 2021-12-21: 10 mg via INTRAVENOUS

## 2021-12-21 MED ORDER — OXYCODONE HCL 5 MG PO TABS
5.0000 mg | ORAL_TABLET | ORAL | Status: DC | PRN
  Administered 2021-12-23 – 2021-12-24 (×3): 10 mg via ORAL
  Filled 2021-12-21 (×4): qty 2

## 2021-12-21 MED ORDER — OXYCODONE HCL 5 MG PO TABS
10.0000 mg | ORAL_TABLET | ORAL | Status: DC | PRN
  Administered 2021-12-21 – 2021-12-22 (×2): 10 mg via ORAL
  Administered 2021-12-22: 15 mg via ORAL
  Administered 2021-12-22 (×2): 10 mg via ORAL
  Administered 2021-12-22 – 2021-12-23 (×4): 15 mg via ORAL
  Filled 2021-12-21: qty 3
  Filled 2021-12-21 (×3): qty 2
  Filled 2021-12-21 (×4): qty 3

## 2021-12-21 MED ORDER — CHLORHEXIDINE GLUCONATE 0.12 % MT SOLN
15.0000 mL | Freq: Once | OROMUCOSAL | Status: AC
  Administered 2021-12-21: 15 mL via OROMUCOSAL

## 2021-12-21 MED ORDER — ROSUVASTATIN CALCIUM 10 MG PO TABS
10.0000 mg | ORAL_TABLET | Freq: Every day | ORAL | Status: DC
  Administered 2021-12-21 – 2021-12-23 (×3): 10 mg via ORAL
  Filled 2021-12-21 (×6): qty 1

## 2021-12-21 MED ORDER — PROPOFOL 10 MG/ML IV BOLUS
INTRAVENOUS | Status: DC | PRN
  Administered 2021-12-21: 20 mg via INTRAVENOUS
  Administered 2021-12-21: 100 mg via INTRAVENOUS

## 2021-12-21 MED ORDER — ACETAMINOPHEN 10 MG/ML IV SOLN
1000.0000 mg | Freq: Once | INTRAVENOUS | Status: DC | PRN
  Administered 2021-12-21: 1000 mg via INTRAVENOUS

## 2021-12-21 MED ORDER — FENTANYL CITRATE PF 50 MCG/ML IJ SOSY
25.0000 ug | PREFILLED_SYRINGE | INTRAMUSCULAR | Status: DC | PRN
  Administered 2021-12-21 (×2): 50 ug via INTRAVENOUS

## 2021-12-21 MED ORDER — HYDROMORPHONE HCL 1 MG/ML IJ SOLN
0.5000 mg | INTRAMUSCULAR | Status: DC | PRN
  Administered 2021-12-21: 1 mg via INTRAVENOUS
  Filled 2021-12-21: qty 1

## 2021-12-21 MED ORDER — ACETAMINOPHEN 325 MG PO TABS
325.0000 mg | ORAL_TABLET | Freq: Four times a day (QID) | ORAL | Status: DC | PRN
  Administered 2021-12-22: 650 mg via ORAL
  Filled 2021-12-21: qty 2

## 2021-12-21 MED ORDER — CEFAZOLIN SODIUM-DEXTROSE 1-4 GM/50ML-% IV SOLN
1.0000 g | Freq: Four times a day (QID) | INTRAVENOUS | Status: AC
  Administered 2021-12-21 (×2): 1 g via INTRAVENOUS
  Filled 2021-12-21 (×2): qty 50

## 2021-12-21 MED ORDER — ASPIRIN 81 MG PO CHEW
81.0000 mg | CHEWABLE_TABLET | Freq: Two times a day (BID) | ORAL | Status: DC
  Administered 2021-12-21 – 2021-12-24 (×6): 81 mg via ORAL
  Filled 2021-12-21 (×6): qty 1

## 2021-12-21 MED ORDER — PANTOPRAZOLE SODIUM 40 MG PO TBEC
40.0000 mg | DELAYED_RELEASE_TABLET | Freq: Every day | ORAL | Status: DC
  Administered 2021-12-21 – 2021-12-23 (×3): 40 mg via ORAL
  Filled 2021-12-21 (×5): qty 1

## 2021-12-21 MED ORDER — CEFAZOLIN SODIUM-DEXTROSE 2-4 GM/100ML-% IV SOLN
2.0000 g | INTRAVENOUS | Status: AC
  Administered 2021-12-21: 2 g via INTRAVENOUS
  Filled 2021-12-21: qty 100

## 2021-12-21 MED ORDER — ACETAMINOPHEN 325 MG PO TABS: 325.0000 mg | ORAL_TABLET | ORAL | Status: DC | PRN

## 2021-12-21 MED ORDER — LACTATED RINGERS IV SOLN
INTRAVENOUS | Status: DC
Start: 2021-12-21 — End: 2021-12-21

## 2021-12-21 MED ORDER — MENTHOL 3 MG MT LOZG: 1.0000 | LOZENGE | OROMUCOSAL | Status: DC | PRN

## 2021-12-21 MED ORDER — PAROXETINE HCL 10 MG PO TABS
10.0000 mg | ORAL_TABLET | Freq: Every morning | ORAL | Status: DC
  Administered 2021-12-22 – 2021-12-23 (×2): 10 mg via ORAL
  Filled 2021-12-21 (×4): qty 1

## 2021-12-21 MED ORDER — DEXAMETHASONE SODIUM PHOSPHATE 10 MG/ML IJ SOLN
INTRAMUSCULAR | Status: DC | PRN
  Administered 2021-12-21: 4 mg via INTRAVENOUS

## 2021-12-21 MED ORDER — ALUM & MAG HYDROXIDE-SIMETH 200-200-20 MG/5ML PO SUSP: 30.0000 mL | ORAL | Status: DC | PRN

## 2021-12-21 MED ORDER — ORAL CARE MOUTH RINSE: 15.0000 mL | Freq: Once | OROMUCOSAL | Status: AC

## 2021-12-21 MED ORDER — POLYETHYLENE GLYCOL 3350 17 G PO PACK: 17.0000 g | PACK | Freq: Every day | ORAL | Status: DC | PRN

## 2021-12-21 MED ORDER — METOCLOPRAMIDE HCL 5 MG/ML IJ SOLN: 5.0000 mg | Freq: Three times a day (TID) | INTRAMUSCULAR | Status: DC | PRN

## 2021-12-21 MED ORDER — OXYCODONE HCL 5 MG/5ML PO SOLN: 5.0000 mg | Freq: Once | ORAL | Status: DC | PRN

## 2021-12-21 MED ORDER — MIDAZOLAM HCL 2 MG/2ML IJ SOLN
INTRAMUSCULAR | Status: AC
Start: 2021-12-21 — End: ?
  Filled 2021-12-21: qty 2

## 2021-12-21 MED ORDER — METHOCARBAMOL 500 MG IVPB - SIMPLE MED
INTRAVENOUS | Status: AC
  Administered 2021-12-21: 500 mg via INTRAVENOUS
  Filled 2021-12-21: qty 50

## 2021-12-21 MED ORDER — MIDAZOLAM HCL 5 MG/5ML IJ SOLN
INTRAMUSCULAR | Status: DC | PRN
  Administered 2021-12-21: 2 mg via INTRAVENOUS

## 2021-12-21 MED ORDER — POVIDONE-IODINE 10 % EX SWAB
2.0000 "application " | Freq: Once | CUTANEOUS | Status: AC
  Administered 2021-12-21: 2 via TOPICAL

## 2021-12-21 MED ORDER — TRANEXAMIC ACID-NACL 1000-0.7 MG/100ML-% IV SOLN
1000.0000 mg | INTRAVENOUS | Status: AC
  Administered 2021-12-21: 1000 mg via INTRAVENOUS
  Filled 2021-12-21: qty 100

## 2021-12-21 MED ORDER — PHENOL 1.4 % MT LIQD: 1.0000 | OROMUCOSAL | Status: DC | PRN

## 2021-12-21 MED ORDER — ONDANSETRON HCL 4 MG PO TABS: 4.0000 mg | ORAL_TABLET | Freq: Four times a day (QID) | ORAL | Status: DC | PRN

## 2021-12-21 MED ORDER — PHENYLEPHRINE 40 MCG/ML (10ML) SYRINGE FOR IV PUSH (FOR BLOOD PRESSURE SUPPORT)
PREFILLED_SYRINGE | INTRAVENOUS | Status: DC | PRN
Start: 2021-12-21 — End: 2021-12-21
  Administered 2021-12-21: 80 ug via INTRAVENOUS

## 2021-12-21 MED ORDER — DEXAMETHASONE SODIUM PHOSPHATE 10 MG/ML IJ SOLN
INTRAMUSCULAR | Status: AC
  Filled 2021-12-21: qty 1

## 2021-12-21 MED ORDER — HYDROMORPHONE HCL 2 MG PO TABS: 1.0000 mg | ORAL_TABLET | ORAL | Status: DC | PRN

## 2021-12-21 MED ORDER — OXYCODONE HCL 5 MG PO TABS: 5.0000 mg | ORAL_TABLET | Freq: Once | ORAL | Status: DC | PRN

## 2021-12-21 MED ORDER — SODIUM CHLORIDE 0.9 % IV SOLN: INTRAVENOUS | Status: DC

## 2021-12-21 MED ORDER — OXYCODONE HCL 5 MG PO TABS
ORAL_TABLET | ORAL | Status: AC
  Filled 2021-12-21: qty 1

## 2021-12-21 MED ORDER — METHOCARBAMOL 500 MG IVPB - SIMPLE MED
500.0000 mg | Freq: Four times a day (QID) | INTRAVENOUS | Status: DC | PRN
  Filled 2021-12-21: qty 50

## 2021-12-21 MED ORDER — HYDROMORPHONE HCL 2 MG PO TABS: 2.0000 mg | ORAL_TABLET | ORAL | Status: DC | PRN

## 2021-12-21 MED ORDER — DOCUSATE SODIUM 100 MG PO CAPS
100.0000 mg | ORAL_CAPSULE | Freq: Two times a day (BID) | ORAL | Status: DC
  Filled 2021-12-21 (×4): qty 1

## 2021-12-21 SURGICAL SUPPLY — 46 items
BAG COUNTER SPONGE SURGICOUNT (BAG) ×2 IMPLANT
BAG ZIPLOCK 12X15 (MISCELLANEOUS) ×1 IMPLANT
BENZOIN TINCTURE PRP APPL 2/3 (GAUZE/BANDAGES/DRESSINGS) IMPLANT
BLADE SAW SGTL 18X1.27X75 (BLADE) ×2 IMPLANT
CABLE GRIP SYSTEM CERCLAGE CABLE WITH CRIMP 1.8MM (Cable) ×2 IMPLANT
CABLE READY CERCLAGE W/CRIP (Trauma Fixation) ×2 IMPLANT
COVER PERINEAL POST (MISCELLANEOUS) ×2 IMPLANT
COVER SURGICAL LIGHT HANDLE (MISCELLANEOUS) ×2 IMPLANT
CUP ACET PINNACLE SECTR 48MM (Joint) IMPLANT
DRAPE FOOT SWITCH (DRAPES) ×2 IMPLANT
DRAPE STERI IOBAN 125X83 (DRAPES) ×2 IMPLANT
DRAPE U-SHAPE 47X51 STRL (DRAPES) ×4 IMPLANT
DRESSING AQUACEL AG SP 3.5X10 (GAUZE/BANDAGES/DRESSINGS) IMPLANT
DRSG AQUACEL AG ADV 3.5X10 (GAUZE/BANDAGES/DRESSINGS) ×2 IMPLANT
DRSG AQUACEL AG SP 3.5X10 (GAUZE/BANDAGES/DRESSINGS) ×2
DURAPREP 26ML APPLICATOR (WOUND CARE) ×2 IMPLANT
ELECT REM PT RETURN 15FT ADLT (MISCELLANEOUS) ×2 IMPLANT
GAUZE XEROFORM 1X8 LF (GAUZE/BANDAGES/DRESSINGS) ×2 IMPLANT
GLOVE BIO SURGEON STRL SZ7.5 (GLOVE) ×2 IMPLANT
GLOVE BIOGEL PI IND STRL 8 (GLOVE) ×2 IMPLANT
GLOVE BIOGEL PI INDICATOR 8 (GLOVE) ×2
GLOVE ECLIPSE 8.0 STRL XLNG CF (GLOVE) ×2 IMPLANT
GOWN STRL REUS W/ TWL XL LVL3 (GOWN DISPOSABLE) ×2 IMPLANT
GOWN STRL REUS W/TWL XL LVL3 (GOWN DISPOSABLE) ×4
HANDPIECE INTERPULSE COAX TIP (DISPOSABLE) ×2
HEAD FEM STD 32X+1 STRL (Hips) ×1 IMPLANT
HOLDER FOLEY CATH W/STRAP (MISCELLANEOUS) ×2 IMPLANT
KIT TURNOVER KIT A (KITS) IMPLANT
LINER ACET 32X48 (Liner) ×1 IMPLANT
PACK ANTERIOR HIP CUSTOM (KITS) ×2 IMPLANT
PINNSECTOR W/GRIP ACE CUP 48MM (Joint) ×2 IMPLANT
SET HNDPC FAN SPRY TIP SCT (DISPOSABLE) ×1 IMPLANT
SPONGE T-LAP 18X18 ~~LOC~~+RFID (SPONGE) ×3 IMPLANT
STAPLER VISISTAT 35W (STAPLE) ×1 IMPLANT
STEM CORAIL KA11 (Stem) ×1 IMPLANT
STRIP CLOSURE SKIN 1/2X4 (GAUZE/BANDAGES/DRESSINGS) IMPLANT
SUT ETHIBOND NAB CT1 #1 30IN (SUTURE) ×1 IMPLANT
SUT ETHILON 2 0 PS N (SUTURE) IMPLANT
SUT MNCRL AB 4-0 PS2 18 (SUTURE) IMPLANT
SUT VIC AB 0 CT1 36 (SUTURE) ×2 IMPLANT
SUT VIC AB 1 CT1 36 (SUTURE) ×2 IMPLANT
SUT VIC AB 2-0 CT1 27 (SUTURE) ×2
SUT VIC AB 2-0 CT1 TAPERPNT 27 (SUTURE) ×2 IMPLANT
TRAY FOLEY MTR SLVR 14FR STAT (SET/KITS/TRAYS/PACK) ×1 IMPLANT
TRAY FOLEY MTR SLVR 16FR STAT (SET/KITS/TRAYS/PACK) IMPLANT
YANKAUER SUCT BULB TIP NO VENT (SUCTIONS) ×2 IMPLANT

## 2021-12-21 NOTE — Anesthesia Procedure Notes (Addendum)
Procedure Name: LMA Insertion ?Date/Time: 12/21/2021 10:52 AM ?Performed by: Wynonia Sours, CRNA ?Pre-anesthesia Checklist: Patient identified, Emergency Drugs available, Suction available, Patient being monitored and Timeout performed ?Patient Re-evaluated:Patient Re-evaluated prior to induction ?Oxygen Delivery Method: Circle system utilized ?Preoxygenation: Pre-oxygenation with 100% oxygen ?Induction Type: IV induction ?Ventilation: Mask ventilation without difficulty ?LMA: LMA with gastric port inserted ?LMA Size: 4.0 ?Number of attempts: 1 ?Tube secured with: Tape ?Dental Injury: Injury to lip and Teeth and Oropharynx as per pre-operative assessment  ?Comments: Small abrasion noted to upper right side of lip. Dr Arby Barrette aware. ? ? ? ? ?

## 2021-12-21 NOTE — Interval H&P Note (Signed)
History and Physical Interval Note: The patient understands that she is here today for right hip replacement to treat her right hip osteoarthritis.  There has been no acute or interval change in her medical status.  Please see recent H&P.  The risks and benefits of surgery been described in detail and informed consent is obtained.  The right operative hip has been marked. ? ?12/21/2021 ?8:56 AM ? ?[name]  has presented today for surgery, with the diagnosis of osteoarthritis right hip.  The various methods of treatment have been discussed with the patient and family. After consideration of risks, benefits and other options for treatment, the patient has consented to  Procedure(s) with comments: ?RIGHT TOTAL HIP ARTHROPLASTY ANTERIOR APPROACH (Right) - Needs RNFA as a surgical intervention.  The patient's history has been reviewed, patient examined, no change in status, stable for surgery.  I have reviewed the patient's chart and labs.  Questions were answered to the patient's satisfaction.   ? ? ?Brenda Mccall ? ? ?

## 2021-12-21 NOTE — Anesthesia Procedure Notes (Addendum)
Spinal ? ?Patient location during procedure: OR ?Reason for block: surgical anesthesia ?Staffing ?Performed: resident/CRNA  ?Anesthesiologist: Hatchett, Franklin, MD ?Resident/CRNA: Walker, Karen L, CRNA ?Preanesthetic Checklist ?Completed: patient identified, IV checked, site marked, risks and benefits discussed, surgical consent, monitors and equipment checked, pre-op evaluation and timeout performed ?Spinal Block ?Patient position: sitting ?Prep: DuraPrep ?Patient monitoring: heart rate, continuous pulse ox and blood pressure ?Approach: midline ?Location: L3-4 ?Injection technique: single-shot ?Needle ?Needle type: Pencan and Introducer  ?Needle gauge: 24 G ?Needle length: 10 cm ?Assessment ?Sensory level: T6 ?Events: CSF return ?Additional Notes ?IV functioning, monitors applied to pt. Expiration date of kit checked and confirmed to be in date. Sterile prep and drape, hand hygiene and sterile gloved used. Pt was positioned and spine was prepped in sterile fashion. Skin was anesthetized with lidocaine. Free flow of clear CSF obtained prior to injecting local anesthetic into CSF. Spinal needle aspirated freely following injection. Needle was carefully withdrawn, and pt tolerated procedure well. Loss of motor and sensory on exam post injection. Dr Hatchett present for procedure ? ? ? ?

## 2021-12-21 NOTE — Brief Op Note (Signed)
12/21/2021 ? ?12:24 PM ? ?PATIENT:  Brenda Mccall  71 y.o. female ? ?PRE-OPERATIVE DIAGNOSIS:  osteoarthritis right hip ? ?POST-OPERATIVE DIAGNOSIS:  osteoarthritis right hip ? ?PROCEDURE:  Procedure(s) with comments: ?RIGHT TOTAL HIP ARTHROPLASTY ANTERIOR APPROACH (Right) - Needs RNFA ? ?SURGEON:  Surgeon(s) and Role: ?   Kathryne Hitch, MD - Primary ? ?ANESTHESIA:   spinal and general ? ?EBL:  350 mL  ? ?COUNTS:  YES ? ?DICTATION: .Other Dictation: Dictation Number [id number] ? ?PLAN OF CARE: Admit for overnight observation ? ?PATIENT DISPOSITION:  PACU - hemodynamically stable. ?  ?Delay start of Pharmacological VTE agent (>24hrs) due to surgical blood loss or risk of bleeding: no ? ?

## 2021-12-21 NOTE — Anesthesia Postprocedure Evaluation (Signed)
Anesthesia Post Note ? ?Patient: Brenda Mccall ? ?Procedure(s) Performed: RIGHT TOTAL HIP ARTHROPLASTY ANTERIOR APPROACH (Right: Hip) ? ?  ? ?Patient location during evaluation: PACU ?Anesthesia Type: Spinal and General ?Level of consciousness: sedated ?Pain management: pain level controlled ?Vital Signs Assessment: post-procedure vital signs reviewed and stable ?Respiratory status: spontaneous breathing ?Cardiovascular status: stable ?Postop Assessment: no headache, no backache, spinal receding, patient able to bend at knees and no apparent nausea or vomiting ?Anesthetic complications: no ? ? ?No notable events documented. ? ?Last Vitals:  ?Vitals:  ? 12/21/21 1320 12/21/21 1330  ?BP:  (!) 102/50  ?Pulse:  84  ?Resp:  13  ?Temp:    ?SpO2: 98% 98%  ?  ?Last Pain:  ?Vitals:  ? 12/21/21 1330  ?TempSrc:   ?PainSc: Asleep  ? ? ?  ?  ?  ?  ?  ?  ? ?Caren Macadam ? ? ? ? ?

## 2021-12-21 NOTE — Transfer of Care (Signed)
Immediate Anesthesia Transfer of Care Note ? ?Patient: Brenda Mccall ? ?Procedure(s) Performed: RIGHT TOTAL HIP ARTHROPLASTY ANTERIOR APPROACH (Right: Hip) ? ?Patient Location: PACU ? ?Anesthesia Type:General and Spinal ? ?Level of Consciousness: awake and patient cooperative ? ?Airway & Oxygen Therapy: Patient Spontanous Breathing and Patient connected to face mask oxygen ? ?Post-op Assessment: Report given to RN and Post -op Vital signs reviewed and stable ? ?Post vital signs: Reviewed and stable ? ?Last Vitals:  ?Vitals Value Taken Time  ?BP 108/71 12/21/21 1224  ?Temp    ?Pulse 77 12/21/21 1227  ?Resp 14 12/21/21 1227  ?SpO2 100 % 12/21/21 1227  ?Vitals shown include unvalidated device data. ? ?Last Pain:  ?Vitals:  ? 12/21/21 0842  ?TempSrc:   ?PainSc: 0-No pain  ?   ? ?  ? ?Complications: No notable events documented. ?

## 2021-12-21 NOTE — Evaluation (Signed)
Physical Therapy Evaluation ?Patient Details ?Name: Brenda Mccall ?MRN: [id number] ?DOB: 10-10-1950 ?Today's Date: 12/21/2021 ? ?History of Present Illness ? Pt s/p R THR and with hx of  L THR and back surgery  ?Clinical Impression ? Pt s/p R THR and presents with decreased R LE strength/ROM and post op pain limiting functional mobility.  Pt should progress to dc home with family assist. ?   ? ?Recommendations for follow up therapy are one component of a multi-disciplinary discharge planning process, led by the attending physician.  Recommendations may be updated based on patient status, additional functional criteria and insurance authorization. ? ?Follow Up Recommendations Follow physician's recommendations for discharge plan and follow up therapies ? ?  ?Assistance Recommended at Discharge Frequent or constant Supervision/Assistance  ?Patient can return home with the following ? A lot of help with walking and/or transfers;A little help with bathing/dressing/bathroom;Assistance with cooking/housework;Assist for transportation;Help with stairs or ramp for entrance ? ?  ?Equipment Recommendations Rolling walker (2 wheels) (Youth level RW please)  ?Recommendations for Other Services ?    ?  ?Functional Status Assessment Patient has had a recent decline in their functional status and demonstrates the ability to make significant improvements in function in a reasonable and predictable amount of time.  ? ?  ?Precautions / Restrictions Precautions ?Precautions: Fall ?Restrictions ?Weight Bearing Restrictions: No ?Other Position/Activity Restrictions: WBAT  ? ?  ? ?Mobility ? Bed Mobility ?Overal bed mobility: Needs Assistance ?Bed Mobility: Supine to Sit, Sit to Supine ?  ?  ?Supine to sit: Min assist, Mod assist ?Sit to supine: Min assist, Mod assist ?  ?General bed mobility comments: Increased time with use of bed rail, cues for sequence and use of L LE to self assist, and physical assist to manage R LE and to control  trunk ?  ? ?Transfers ?Overall transfer level: Needs assistance ?Equipment used: Rolling walker (2 wheels) ?Transfers: Sit to/from Stand ?Sit to Stand: Mod assist ?  ?  ?  ?  ?  ?General transfer comment: cues for LE management and use of UEs to self assist.  Physical assist to bring wt up and fwd and to balance in standing ?  ? ?Ambulation/Gait ?Ambulation/Gait assistance: Min assist, Mod assist ?Gait Distance (Feet): 3 Feet ?Assistive device: Rolling walker (2 wheels) ?Gait Pattern/deviations: Step-to pattern, Decreased step length - right, Decreased step length - left, Shuffle ?Gait velocity: decr ?  ?  ?General Gait Details: cues for posture and sequence to side step up side of bed only ? ?Stairs ?  ?  ?  ?  ?  ? ?Wheelchair Mobility ?  ? ?Modified Rankin (Stroke Patients Only) ?  ? ?  ? ?Balance Overall balance assessment: Needs assistance ?Sitting-balance support: No upper extremity supported, Feet supported ?Sitting balance-Leahy Scale: Fair ?  ?  ?Standing balance support: Bilateral upper extremity supported ?Standing balance-Leahy Scale: Poor ?  ?  ?  ?  ?  ?  ?  ?  ?  ?  ?  ?  ?   ? ? ? ?Pertinent Vitals/Pain Pain Assessment ?Pain Assessment: 0-10 ?Pain Score: 4  ?Pain Location: R hip ?Pain Descriptors / Indicators: Aching, Sore ?Pain Intervention(s): Limited activity within patient's tolerance, Monitored during session, Premedicated before session, Ice applied  ? ? ?Home Living Family/patient expects to be discharged to:: Private residence ?Living Arrangements: Spouse/significant other ?Available Help at Discharge: Family;Available 24 hours/day ?Type of Home: House ?Home Access: Stairs to enter ?Entrance Stairs-Rails: Right ?Entrance Stairs-Number of  Steps: 2 ?  ?Home Layout: One level ?Home Equipment: Kasandra Knudsen - single point;Shower seat ?   ?  ?Prior Function Prior Level of Function : Independent/Modified Independent ?  ?  ?  ?  ?  ?  ?  ?  ?  ? ? ?Hand Dominance  ? Dominant Hand: Right ? ?  ?Extremity/Trunk  Assessment  ? Upper Extremity Assessment ?Upper Extremity Assessment: Overall WFL for tasks assessed ?  ? ?Lower Extremity Assessment ?Lower Extremity Assessment: RLE deficits/detail ?  ? ?Cervical / Trunk Assessment ?Cervical / Trunk Assessment: Normal  ?Communication  ? Communication: No difficulties  ?Cognition Arousal/Alertness: Awake/alert ?Behavior During Therapy: [facility] for tasks assessed/performed ?Overall Cognitive Status: Within Functional Limits for tasks assessed ?  ?  ?  ?  ?  ?  ?  ?  ?  ?  ?  ?  ?  ?  ?  ?  ?  ?  ?  ? ?  ?General Comments   ? ?  ?Exercises Total Joint Exercises ?Ankle Circles/Pumps: AROM, Both, 15 reps, Supine  ? ?Assessment/Plan  ?  ?PT Assessment Patient needs continued PT services  ?PT Problem List Decreased strength;Decreased range of motion;Decreased activity tolerance;Decreased balance;Decreased mobility;Decreased knowledge of use of DME;Pain ? ?   ?  ?PT Treatment Interventions DME instruction;Gait training;Stair training;Functional mobility training;Therapeutic activities;Therapeutic exercise;Patient/family education;Balance training   ? ?PT Goals (Current goals can be found in the Care Plan section)  ?Acute Rehab PT Goals ?Patient Stated Goal: Regain IND ?PT Goal Formulation: With patient ?Time For Goal Achievement: 12/28/21 ?Potential to Achieve Goals: Good ? ?  ?Frequency 7X/week ?  ? ? ?Co-evaluation   ?  ?  ?  ?  ? ? ?  ?AM-PAC PT "6 Clicks" Mobility  ?Outcome Measure Help needed turning from your back to your side while in a flat bed without using bedrails?: A Little ?Help needed moving from lying on your back to sitting on the side of a flat bed without using bedrails?: A Lot ?Help needed moving to and from a bed to a chair (including a wheelchair)?: A Lot ?Help needed standing up from a chair using your arms (e.g., wheelchair or bedside chair)?: A Lot ?Help needed to walk in hospital room?: Total ?Help needed climbing 3-5 steps with a railing? : Total ?6 Click Score:  11 ? ?  ?End of Session Equipment Utilized During Treatment: Gait belt ?Activity Tolerance: Patient limited by fatigue ?Patient left: in bed;with call bell/phone within reach;with family/visitor present ?Nurse Communication: Mobility status ?PT Visit Diagnosis: Difficulty in walking, not elsewhere classified (R26.2);Pain ?Pain - Right/Left: Right ?Pain - part of body: Hip ?  ? ?Time: [id number] ?PT Time Calculation (min) (ACUTE ONLY): 34 min ? ? ?Charges:   PT Evaluation ?$PT Eval Low Complexity: 1 Low ?PT Treatments ?$Therapeutic Activity: 8-22 mins ?  ?   ? ? ?Debe Coder PT ?Acute Rehabilitation Services ?Pager [phone] ?Office [phone] ? ? ?Marlisha Vanwyk ?12/21/2021, 6:00 PM ? ?

## 2021-12-21 NOTE — Anesthesia Preprocedure Evaluation (Signed)
Anesthesia Evaluation  ?Patient identified by MRN, date of birth, ID band ?Patient awake ? ? ? ?Reviewed: ?Allergy & Precautions, NPO status , Patient's Chart, lab work & pertinent test results ? ?History of Anesthesia Complications ?Negative for: history of anesthetic complications ? ?Airway ?Mallampati: II ? ?TM Distance: >3 FB ?Neck ROM: Full ? ? ? Dental ?no notable dental hx. ?(+) Teeth Intact ?  ?Pulmonary ?neg pulmonary ROS,  ?  ?Pulmonary exam normal ?breath sounds clear to auscultation ? ? ? ? ? ? Cardiovascular ?hypertension, Pt. on medications ?negative cardio ROS ?Normal cardiovascular exam ?Rhythm:Regular Rate:Normal ? ? ?  ?Neuro/Psych ?negative neurological ROS ? negative psych ROS  ? GI/Hepatic ?negative GI ROS, Neg liver ROS,   ?Endo/Other  ?Obese, BMI 31.5 ? Renal/GU ?negative Renal ROS  ? ?  ?Musculoskeletal ? ?(+) Arthritis , Osteoarthritis,   ? Abdominal ?(+) + obese,   ?Peds ? Hematology ?negative hematology ROS ?(+)   ?Anesthesia Other Findings ? ? Reproductive/Obstetrics ? ?  ? ? ? ? ? ? ? ? ? ? ? ? ? ?  ?  ? ? ? ? ? ? ? ? ?Anesthesia Physical ? ?Anesthesia Plan ? ?ASA: II ? ?Anesthesia Plan: Spinal  ? ?Post-op Pain Management:   ? ?Induction: Intravenous ? ?PONV Risk Score and Plan: 2 and Treatment may vary due to age or medical condition, Ondansetron, Dexamethasone, Propofol infusion and TIVA ? ?Airway Management Planned: Nasal Cannula and Natural Airway ? ?Additional Equipment:  ? ?Intra-op Plan:  ? ?Post-operative Plan:  ? ?Informed Consent: I have reviewed the patients History and Physical, chart, labs and discussed the procedure including the risks, benefits and alternatives for the proposed anesthesia with the patient or authorized representative who has indicated his/her understanding and acceptance.  ? ? ? ? ? ?Plan Discussed with: CRNA ? ?Anesthesia Plan Comments:   ? ? ? ? ? ? ?Anesthesia Quick Evaluation ? ?

## 2021-12-21 NOTE — Anesthesia Procedure Notes (Addendum)
Procedure Name: [place] ?Date/Time: 12/21/2021 10:15 AM ?Performed by: West Pugh, CRNA ?Pre-anesthesia Checklist: Patient identified, Emergency Drugs available, Suction available, Patient being monitored and Timeout performed ?Patient Re-evaluated:Patient Re-evaluated prior to induction ?Oxygen Delivery Method: Simple face mask ?Preoxygenation: Pre-oxygenation with 100% oxygen ?Induction Type: IV induction ?Placement Confirmation: positive ETCO2 ? ? ? ? ?

## 2021-12-22 DIAGNOSIS — I959 Hypotension, unspecified: Secondary | ICD-10-CM | POA: Diagnosis present

## 2021-12-22 DIAGNOSIS — M1611 Unilateral primary osteoarthritis, right hip: Secondary | ICD-10-CM | POA: Diagnosis present

## 2021-12-22 DIAGNOSIS — Z881 Allergy status to other antibiotic agents status: Secondary | ICD-10-CM | POA: Diagnosis not present

## 2021-12-22 DIAGNOSIS — Z79899 Other long term (current) drug therapy: Secondary | ICD-10-CM | POA: Diagnosis not present

## 2021-12-22 DIAGNOSIS — Z96642 Presence of left artificial hip joint: Secondary | ICD-10-CM | POA: Diagnosis present

## 2021-12-22 DIAGNOSIS — Z888 Allergy status to other drugs, medicaments and biological substances status: Secondary | ICD-10-CM | POA: Diagnosis not present

## 2021-12-22 DIAGNOSIS — D62 Acute posthemorrhagic anemia: Secondary | ICD-10-CM | POA: Diagnosis not present

## 2021-12-22 DIAGNOSIS — I10 Essential (primary) hypertension: Secondary | ICD-10-CM | POA: Diagnosis present

## 2021-12-22 LAB — CBC
HCT: 30.1 % — ABNORMAL LOW (ref 36.0–46.0)
Hemoglobin: 9.6 g/dL — ABNORMAL LOW (ref 12.0–15.0)
MCH: 31.8 pg (ref 26.0–34.0)
MCHC: 31.9 g/dL (ref 30.0–36.0)
MCV: 99.7 fL (ref 80.0–100.0)
Platelets: 248 10*3/uL (ref 150–400)
RBC: 3.02 MIL/uL — ABNORMAL LOW (ref 3.87–5.11)
RDW: 13.2 % (ref 11.5–15.5)
WBC: 10.7 10*3/uL — ABNORMAL HIGH (ref 4.0–10.5)
nRBC: 0 % (ref 0.0–0.2)

## 2021-12-22 LAB — BASIC METABOLIC PANEL
Anion gap: 6 (ref 5–15)
BUN: 11 mg/dL (ref 8–23)
CO2: 29 mmol/L (ref 22–32)
Calcium: 8.6 mg/dL — ABNORMAL LOW (ref 8.9–10.3)
Chloride: 105 mmol/L (ref 98–111)
Creatinine, Ser: 0.64 mg/dL (ref 0.44–1.00)
GFR, Estimated: >60 mL/min (ref >60)
Glucose, Bld: 145 mg/dL — ABNORMAL HIGH (ref 70–99)
Potassium: 4.6 mmol/L (ref 3.5–5.1)
Sodium: 140 mmol/L (ref 135–145)

## 2021-12-22 MED ORDER — OXYCODONE HCL 5 MG PO TABS: 5.0000 mg | ORAL_TABLET | ORAL | 0 refills | Status: AC | PRN

## 2021-12-22 MED ORDER — METHOCARBAMOL 500 MG PO TABS: 500.0000 mg | ORAL_TABLET | Freq: Four times a day (QID) | ORAL | 1 refills | Status: AC | PRN

## 2021-12-22 MED ORDER — ASPIRIN 81 MG PO CHEW: 81.0000 mg | CHEWABLE_TABLET | Freq: Two times a day (BID) | ORAL | 1 refills | Status: AC

## 2021-12-22 MED ORDER — SODIUM CHLORIDE 0.9 % IV BOLUS
1000.0000 mL | Freq: Once | INTRAVENOUS | Status: AC
  Administered 2021-12-22: 1000 mL via INTRAVENOUS

## 2021-12-22 NOTE — Plan of Care (Signed)
  Problem: Health Behavior/Discharge Planning: Goal: Ability to manage health-related needs will improve Outcome: Progressing   

## 2021-12-22 NOTE — Progress Notes (Signed)
Physical Therapy Treatment ?Patient Details ?Name: Brenda Mccall ?MRN: [id number] ?DOB: 10/24/1950 ?Today's Date: 12/22/2021 ? ? 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 ?  ?PT Comments  ? ? Pt continues very cooperative and with noted improvement in activity tolerance, balance and with decreased assist required for most tasks.  However, pt continues to require increased time for all tasks and with c/o dizziness following limited ambulation in hall - BP 61/41 - RN aware.   ?Recommendations for follow up therapy are one component of a multi-disciplinary discharge planning process, led by the attending physician.  Recommendations may be updated based on patient status, additional functional criteria and insurance authorization. ? ?Follow Up Recommendations ? Follow physician's recommendations for discharge plan and follow up therapies ?  ?  ?Assistance Recommended at Discharge Frequent or constant Supervision/Assistance  ?Patient can return home with the following A lot of help with walking and/or transfers;A little help with bathing/dressing/bathroom;Assistance with cooking/housework;Assist for transportation;Help with stairs or ramp for entrance ?  ?Equipment Recommendations ? Rolling walker (2 wheels)  ?  ?Recommendations for Other Services   ? ? ?  ?Precautions / Restrictions Precautions ?Precautions: Fall ?Restrictions ?Weight Bearing Restrictions: No ?Other Position/Activity Restrictions: WBAT  ?  ? ?Mobility ? Bed Mobility ?Overal bed mobility: Needs Assistance ?Bed Mobility: Supine to Sit ?  ?  ?Supine to sit: Min assist ?  ?  ?General bed mobility comments: Increased time with use of bed rail, cues for sequence and use of L LE to self assist, and physical assist to manage R LE and to control trunk ?  ? ?Transfers ?Overall transfer level: Needs assistance ?Equipment used: Rolling walker (2 wheels) ?Transfers: Sit to/from Stand ?Sit to Stand: Min assist ?  ?  ?  ?  ?   ?General transfer comment: cues for LE management and use of UEs to self assist.  Physical assist to bring wt up and fwd and to balance in standing ?  ? ?Ambulation/Gait ?Ambulation/Gait assistance: Min assist ?Gait Distance (Feet): 34 Feet ?Assistive device: Rolling walker (2 wheels) ?Gait Pattern/deviations: Step-to pattern, Decreased step length - right, Decreased step length - left, Shuffle ?Gait velocity: decr ?  ?  ?General Gait Details: cues for posture, position from RW and initial sequence.  Distance ltd by orthostatic hypotension - BP 61/41 ? ? ?Stairs ?  ?  ?  ?  ?  ? ? ?Wheelchair Mobility ?  ? ?Modified Rankin (Stroke Patients Only) ?  ? ? ?  ?Balance Overall balance assessment: Needs assistance ?Sitting-balance support: No upper extremity supported, Feet supported ?Sitting balance-Leahy Scale: Good ?  ?  ?Standing balance support: Bilateral upper extremity supported ?Standing balance-Leahy Scale: Poor ?  ?  ?  ?  ?  ?  ?  ?  ?  ?  ?  ?  ?  ? ?  ?Cognition Arousal/Alertness: Awake/alert ?Behavior During Therapy: [facility] for tasks assessed/performed ?Overall Cognitive Status: Within Functional Limits for tasks assessed ?  ?  ?  ?  ?  ?  ?  ?  ?  ?  ?  ?  ?  ?  ?  ?  ?  ?  ?  ? ?  ?Exercises Total Joint Exercises ?Ankle Circles/Pumps: AROM, Both, 15 reps, Supine ?Quad Sets: AROM, Both, 10 reps, Supine ?Heel Slides: AAROM, Right, 20 reps, Supine ?Hip ABduction/ADduction: AAROM, Right, 15 reps, Supine ? ?  ?General Comments   ?  ?  ? ?  Pertinent Vitals/Pain Pain Assessment ?Pain Assessment: 0-10 ?Pain Score: 4  ?Pain Location: R hip ?Pain Descriptors / Indicators: Aching, Sore ?Pain Intervention(s): Limited activity within patient's tolerance, Monitored during session, Premedicated before session, Ice applied  ? ? ?Home Living   ?  ?  ?  ?  ?  ?  ?  ?  ?  ?   ?  ?Prior Function    ?  ?  ?   ? ?PT Goals (current goals can now be found in the care plan section) Acute Rehab PT Goals ?Patient Stated Goal: Regain  IND ?PT Goal Formulation: With patient ?Time For Goal Achievement: 12/28/21 ?Potential to Achieve Goals: Good ?Progress towards PT goals: Progressing toward goals ? ?  ?Frequency ? ? ? 7X/week ? ? ? ?  ?PT Plan Current plan remains appropriate  ? ? ?Co-evaluation   ?  ?  ?  ?  ? ?  ?AM-PAC PT "6 Clicks" Mobility   ?Outcome Measure ? Help needed turning from your back to your side while in a flat bed without using bedrails?: A Little ?Help needed moving from lying on your back to sitting on the side of a flat bed without using bedrails?: A Little ?Help needed moving to and from a bed to a chair (including a wheelchair)?: A Little ?Help needed standing up from a chair using your arms (e.g., wheelchair or bedside chair)?: A Little ?Help needed to walk in hospital room?: A Little ?Help needed climbing 3-5 steps with a railing? : A Lot ?6 Click Score: 17 ? ?  ?End of Session Equipment Utilized During Treatment: Gait belt ?Activity Tolerance: Patient limited by fatigue;Other (comment) (orthostatic) ?Patient left: in chair;with call bell/phone within reach;with chair alarm set ?Nurse Communication: Mobility status ?PT Visit Diagnosis: Difficulty in walking, not elsewhere classified (R26.2);Pain ?Pain - Right/Left: Right ?Pain - part of body: Hip ?  ? ? ?Time: 2993-7169 ?PT Time Calculation (min) (ACUTE ONLY): 52 min ? ?Charges:  $Gait Training: 8-22 mins ?$Therapeutic Exercise: 8-22 mins ?$Therapeutic Activity: 8-22 mins          ?          ? ?Mauro Kaufmann PT ?Acute Rehabilitation Services ?Pager [phone] ?Office [phone] ? ? ? ?Kelle Ruppert ?12/22/2021, 10:46 AM ? ?

## 2021-12-22 NOTE — Progress Notes (Signed)
Subjective: ?1 Day Post-Op Procedure(s) (LRB): ?RIGHT TOTAL HIP ARTHROPLASTY ANTERIOR APPROACH (Right) ?Patient reports pain as moderate.  Acute blood loss anemia from surgery.  Some hypotension.  Is receiving a normal saline bolus.  Not light-headed. ? ?Objective: ?Vital signs in last 24 hours: ?Temp:  [97.5 ?F (36.4 ?C)-99.5 ?F (37.5 ?C)] 99.5 ?F (37.5 ?C) (04/08 0932) ?Pulse Rate:  [70-88] 84 (04/08 0932) ?Resp:  [8-19] 18 (04/08 0932) ?BP: (102-137)/(43-71) 117/58 (04/08 0932) ?SpO2:  [95 %-100 %] 95 % (04/08 0932) ? ?Intake/Output from previous day: ?04/07 0701 - 04/08 0700 ?In: [id number] [P.O.:770; I.V.:2640.3; IV Piggyback:529.7] ?Out: 2150 [Urine:1800; Blood:350] ?Intake/Output this shift: ?Total I/O ?In: 240 [P.O.:240] ?Out: -  ? ?Recent Labs  ?  12/22/21 ?0324  ?HGB 9.6*  ? ?Recent Labs  ?  12/22/21 ?0324  ?WBC 10.7*  ?RBC 3.02*  ?HCT 30.1*  ?PLT 248  ? ?Recent Labs  ?  12/22/21 ?0324  ?NA 140  ?K 4.6  ?CL 105  ?CO2 29  ?BUN 11  ?CREATININE 0.64  ?GLUCOSE 145*  ?CALCIUM 8.6*  ? ?No results for input(s): LABPT, INR in the last 72 hours. ? ?Sensation intact distally ?Intact pulses distally ?Dorsiflexion/Plantar flexion intact ?Incision: dressing C/D/I ? ? ?Assessment/Plan: ?1 Day Post-Op Procedure(s) (LRB): ?RIGHT TOTAL HIP ARTHROPLASTY ANTERIOR APPROACH (Right) ?Up with therapy ?Plan for discharge tomorrow ?Discharge home with home health ? ? ? ? ? ?Mcarthur Rossetti ?12/22/2021, 10:44 AM ? ?

## 2021-12-22 NOTE — Discharge Instructions (Signed)

## 2021-12-22 NOTE — Plan of Care (Signed)
?  Problem: Safety: ?Goal: Ability to remain free from injury will improve ?Outcome: Progressing ?  ?Problem: Education: ?Goal: Knowledge of the prescribed therapeutic regimen will improve ?Outcome: Progressing ?  ?Problem: Pain Management: ?Goal: Pain level will decrease with appropriate interventions ?Outcome: Progressing ?  ?Problem: Skin Integrity: ?Goal: Will show signs of wound healing ?Outcome: Progressing ?  ?

## 2021-12-22 NOTE — Op Note (Signed)
NAME: Brenda Mccall, Brenda Mccall ?MEDICAL RECORD NO: [id number] ?ACCOUNT NO: [id number] ?DATE OF BIRTH: 06-19-1951 ?FACILITY: [facility] ?LOCATION: [facility] ?PHYSICIAN: Lind Guest. Ninfa Linden, MD ? ?Operative Report  ? ?DATE OF PROCEDURE: 12/21/2021 ? ?PREOPERATIVE DIAGNOSIS:  Primary osteoarthritis and degenerative joint disease, right hip. ? ?POSTOPERATIVE DIAGNOSIS:  Primary osteoarthritis and degenerative joint disease, right hip. ? ?PROCEDURE:  Right total hip arthroplasty through a direct anterior approach. ? ?IMPLANTS:  DePuy Sector Gription acetabular component size 48, size 32+0 neutral polyethylene liner, size 11 Corail femoral component with standard offset, size 32 +1 metal hip ball, one Zimmer cable around the calcar just proximal to the lesser  ?trochanter. ? ?SURGEON:  Lind Guest. Ninfa Linden, MD ? ?ANESTHESIA: ?1.  Attempted spinal. ?2.  General. ? ?BLOOD LOSS:  300 mL. ? ?COMPLICATIONS:  Small crack in the calcar fixed with a Zimmer cable. ? ?INDICATIONS:  The patient is a very pleasant 71 year old female well known to me.  I actually replaced her left hip a few years ago.  Her right hip has severe end-stage arthritis and at this point, it is detrimentally affecting her mobility, her quality  ?of life and activities of daily living.  She is actually a little shorter on that side as well having lost joint space.  At this point, her right hip pain is detrimentally affecting her mobility, her quality of life and activities of daily living to the  ?point she does wish to proceed with total hip arthroplasty on the right side.  She is fully aware of the risk of acute blood loss anemia, nerve or vessel injury, fracture, infection, DVT, implant failure, leg length differences and skin and soft tissue  ?issues.  She understands our goals are to decrease pain, improve mobility and overall improve quality of life. ? ?DESCRIPTION OF PROCEDURE:  After informed consent was obtained, appropriate right hip was marked.  She was brought  to the operating room and sat up on the stretcher where spinal anesthesia was obtained.  A Foley catheter was placed and traction boots  ?were placed on both her feet.  Next, she was placed supine on the Hana fracture table. The perineal post in place and both legs in line skeletal traction device and no traction applied.  We assessed her left hip and right hip radiographically.  Her right ? hip was then prepped and draped in DuraPrep and sterile drapes.  A timeout was called.  She was identified as correct patient, correct right hip.  I then pinched her skin in 2 different areas and she did not move, so I made a skin incision with a #10  ?blade.  I dissected down the soft tissues and although she did not move, it seems like that she was responding to pain, so general anesthesia was obtained via LMA.  We then proceeded with the case.  We dissected down tensor fascia lata muscle.  Tensor  ?fascia was then divided longitudinally to proceed with direct anterior approach to the hip.  I identified and cauterized circumflex vessels and identified the hip capsule, opened the hip capsule in L-type format finding a moderate joint effusion.  I  ?opened up the joint capsule and placed curved retractors around the medial and lateral femoral neck and made a femoral neck cut with an oscillating saw just proximal to the lesser trochanter and completed this with an osteotome.  We placed a corkscrew  ?guide in the femoral head and removed the femoral head in its entirety and found a wide area devoid  of cartilage.  I then placed a bent Hohmann over the medial acetabular rim and removed remnants of acetabular labrum and other debris.  I then began  ?reaming in a stepwise increments from a size 43 reamer only going to a size 47 because she had such a small hip ball.  We then placed a real size 48 acetabular component under direct visualization and under direct fluoroscopy with the last reamer also  ?placed under direct visualization  and direct fluoroscopy, so we could obtain our depth of reaming, our inclination and anteversion.  I then placed a real 48 acetabular component with a 32+0 polyethylene liner.  Attention was then turned to the femur.   ?With the leg externally rotated to 120 degrees and adducted, we were able to place a Mueller retractor medially and Hohmann retractor behind the greater trochanter.  I released lateral joint capsule and used a box cutting osteotome to enter femoral canal ? and rongeur to lateralize. We then began broaching using the Corail broaching system from a size 8, we went up to size 11.  Once again, the size 11 in place.  I did notice that there was a crack in the calcar area and it seemed to propagate just to the  ?lesser trochanter, but I felt better about putting a single Zimmer cable around while I had the broach in place.  So, I was able to put a single cable and I felt this gave good stability and the fracture did not appear unstable at all.  I then trialed a  ?standard offset femoral neck and a 32+1 hip ball, and reduced this in the acetabulum.  We were pleased with leg length, offset, range of motion and stability assessed radiographically and mechanically.  We then dislocated the hip, removed the trial  ?components.  We placed the real Corail femoral component with standard offset size 11 and the real 32+1 metal hip ball and again reduced this in acetabulum.  We were pleased with stability and assessing this radiographically and mechanically.  We then  ?irrigated the soft tissue with normal saline solution.  The joint capsule was closed with interrupted #1 Ethibond suture followed by #1 Vicryl to close the tensor fascia.  0 Vicryl was used to close deep tissue and 2-0 Vicryl was used to close  ?subcutaneous tissue.  The skin was closed with staples.  An Aquacel dressing was applied.  She was taken off the Hana table, awakened, extubated, and taken to recovery room in stable condition. ? ? ?NIK ?D:  12/21/2021 12:23:49 pm T: 12/22/2021 12:22:00 am  ?JOB: [id number]  ?

## 2021-12-22 NOTE — Progress Notes (Signed)
Physical Therapy Treatment ?Patient Details ?Name: Brenda Mccall ?MRN: [id number] ?DOB: Apr 11, 1951 ?Today's Date: 12/22/2021 ? ? 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 ?  ?PT Comments  ? ? Pt continues very cooperative but requiring increased time for all tasks and with activity pain limited.  This pm, pt tolerated to/from bathroom for toileting and unable to progress further - pt requesting IV pain meds at session end and RN notified.  ?Recommendations for follow up therapy are one component of a multi-disciplinary discharge planning process, led by the attending physician.  Recommendations may be updated based on patient status, additional functional criteria and insurance authorization. ? ?Follow Up Recommendations ? Follow physician's recommendations for discharge plan and follow up therapies ?  ?  ?Assistance Recommended at Discharge Frequent or constant Supervision/Assistance  ?Patient can return home with the following A lot of help with walking and/or transfers;A little help with bathing/dressing/bathroom;Assistance with cooking/housework;Assist for transportation;Help with stairs or ramp for entrance ?  ?Equipment Recommendations ? Rolling walker (2 wheels)  ?  ?Recommendations for Other Services   ? ? ?  ?Precautions / Restrictions Precautions ?Precautions: Fall ?Restrictions ?Weight Bearing Restrictions: No ?Other Position/Activity Restrictions: WBAT  ?  ? ?Mobility ? Bed Mobility ?Overal bed mobility: Needs Assistance ?Bed Mobility: Supine to Sit ?  ?  ?Supine to sit: Min assist ?  ?  ?General bed mobility comments: Pt up in chair and requests back to same ?  ? ?Transfers ?Overall transfer level: Needs assistance ?Equipment used: Rolling walker (2 wheels) ?Transfers: Sit to/from Stand ?Sit to Stand: Min assist ?  ?  ?  ?  ?  ?General transfer comment: cues for LE management and use of UEs to self assist.  Physical assist to bring wt up and fwd and to balance in  standing ?  ? ?Ambulation/Gait ?Ambulation/Gait assistance: Min assist ?Gait Distance (Feet): 16 Feet (16' twice - to/from bathroom) ?Assistive device: Rolling walker (2 wheels) ?Gait Pattern/deviations: Step-to pattern, Decreased step length - right, Decreased step length - left, Shuffle ?Gait velocity: decr ?  ?  ?General Gait Details: cues for posture, position from RW and initial sequence.  Distance ltd by orthostatic hypotension - BP 61/41 ? ? ?Stairs ?  ?  ?  ?  ?  ? ? ?Wheelchair Mobility ?  ? ?Modified Rankin (Stroke Patients Only) ?  ? ? ?  ?Balance Overall balance assessment: Needs assistance ?Sitting-balance support: No upper extremity supported, Feet supported ?Sitting balance-Leahy Scale: Good ?  ?  ?Standing balance support: Bilateral upper extremity supported ?Standing balance-Leahy Scale: Poor ?  ?  ?  ?  ?  ?  ?  ?  ?  ?  ?  ?  ?  ? ?  ?Cognition Arousal/Alertness: Awake/alert ?Behavior During Therapy: [facility] for tasks assessed/performed ?Overall Cognitive Status: Within Functional Limits for tasks assessed ?  ?  ?  ?  ?  ?  ?  ?  ?  ?  ?  ?  ?  ?  ?  ?  ?  ?  ?  ? ?  ?Exercises Total Joint Exercises ?Ankle Circles/Pumps: AROM, Both, 15 reps, Supine ?Quad Sets: AROM, Both, 10 reps, Supine ?Heel Slides: AAROM, Right, 20 reps, Supine ?Hip ABduction/ADduction: AAROM, Right, 15 reps, Supine ? ?  ?General Comments   ?  ?  ? ?Pertinent Vitals/Pain Pain Assessment ?Pain Assessment: 0-10 ?Pain Score: 8  ?Pain Location: R  hip ?Pain Descriptors / Indicators: Aching, Sore, Grimacing, Guarding ?Pain Intervention(s): Limited activity within patient's tolerance, Monitored during session, Premedicated before session, Patient requesting pain meds-RN notified  ? ? ?Home Living   ?  ?  ?  ?  ?  ?  ?  ?  ?  ?   ?  ?Prior Function    ?  ?  ?   ? ?PT Goals (current goals can now be found in the care plan section) Acute Rehab PT Goals ?Patient Stated Goal: Regain IND ?PT Goal Formulation: With patient ?Time For Goal  Achievement: 12/28/21 ?Potential to Achieve Goals: Good ?Progress towards PT goals: Not progressing toward goals - comment (pain limited) ? ?  ?Frequency ? ? ? 7X/week ? ? ? ?  ?PT Plan Current plan remains appropriate  ? ? ?Co-evaluation   ?  ?  ?  ?  ? ?  ?AM-PAC PT "6 Clicks" Mobility   ?Outcome Measure ? Help needed turning from your back to your side while in a flat bed without using bedrails?: A Little ?Help needed moving from lying on your back to sitting on the side of a flat bed without using bedrails?: A Little ?Help needed moving to and from a bed to a chair (including a wheelchair)?: A Little ?Help needed standing up from a chair using your arms (e.g., wheelchair or bedside chair)?: A Little ?Help needed to walk in hospital room?: Total ?Help needed climbing 3-5 steps with a railing? : Total ?6 Click Score: 14 ? ?  ?End of Session Equipment Utilized During Treatment: Gait belt ?Activity Tolerance: Patient limited by fatigue;Patient limited by pain ?Patient left: in chair;with call bell/phone within reach;with chair alarm set;with family/visitor present ?Nurse Communication: Mobility status ?PT Visit Diagnosis: Difficulty in walking, not elsewhere classified (R26.2);Pain ?Pain - Right/Left: Right ?Pain - part of body: Hip ?  ? ? ?Time: [id number] ?PT Time Calculation (min) (ACUTE ONLY): 31 min ? ?Charges:  $Gait Training: 8-22 mins ?$Therapeutic Activity: 8-22 mins          ?          ? ?Debe Coder PT ?Acute Rehabilitation Services ?Pager [phone] ?Office [phone] ? ? ? ?Basilio Meadow ?12/22/2021, 3:17 PM ? ?

## 2021-12-22 NOTE — TOC Transition Note (Signed)
Transition of Care (TOC) - CM/SW Discharge Note ? ? ?Patient Details  ?Name: HEAVIN SEBREE ?MRN: [id number] ?Date of Birth: [birth year] ? ?Transition of Care (TOC) CM/SW Contact:  ?Renesmae Donahey, LCSW ?Phone Number: ?12/22/2021, 10:11 AM ? ? ?Clinical Narrative:    ?Met with pt and confirming need for youth rolling walker - no agency pref - order placed with [facility] for deliver to her room.  Pt aware she was prearranged with [facility] for HHPT.  No further TOC needs. ? ? ?Final next level of care: [place] ?Barriers to Discharge: No Barriers Identified ? ? ?Patient Goals and CMS Choice ?Patient states their goals for this hospitalization and ongoing recovery are:: return home ?  ?  ? ?Discharge Placement ?  ?           ?  ?  ?  ?  ? ?Discharge Plan and Services ?  ?  ?           ?DME Arranged: [facility] ?DME Agency: [employer] ?Date DME Agency Contacted: 12/22/21 ?Time DME Agency Contacted: [phone] ?Representative spoke with at DME Agency: Delana Meyer ?HH Arranged: PT ?[place] Agency: [place] ?  ?  ?  ? ?Social Determinants of Health (SDOH) Interventions ?  ? ? ?Readmission Risk Interventions ?   ? View : No data to display.  ?  ?  ?  ? ? ? ? ? ?

## 2021-12-23 NOTE — Progress Notes (Signed)
Physical Therapy Treatment ?Patient Details ?Name: Brenda Mccall ?MRN: [id number] ?DOB: 1951-08-01 ?Today's Date: 12/23/2021 ? ? 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 ?  ?PT Comments  ? ? Pt requesting assist to bathroom but pain limited and only able to tolerate transfer to [facility] and then to recliner.   RN advised and providing pain meds during session.  ?Recommendations for follow up therapy are one component of a multi-disciplinary discharge planning process, led by the attending physician.  Recommendations may be updated based on patient status, additional functional criteria and insurance authorization. ? ?Follow Up Recommendations ? Follow physician's recommendations for discharge plan and follow up therapies ?  ?  ?Assistance Recommended at Discharge Frequent or constant Supervision/Assistance  ?Patient can return home with the following A lot of help with walking and/or transfers;A little help with bathing/dressing/bathroom;Assistance with cooking/housework;Assist for transportation;Help with stairs or ramp for entrance ?  ?Equipment Recommendations ? Rolling walker (2 wheels)  ?  ?Recommendations for Other Services   ? ? ?  ?Precautions / Restrictions Precautions ?Precautions: Fall ?Restrictions ?Weight Bearing Restrictions: No ?Other Position/Activity Restrictions: WBAT  ?  ? ?Mobility ? Bed Mobility ?Overal bed mobility: Needs Assistance ?Bed Mobility: Supine to Sit ?  ?  ?Supine to sit: Min assist, Mod assist ?  ?  ?General bed mobility comments: Increased time with assist to manage R LE, to control trunk and to complete rotation to EOB sitting utilizing bed pad. ?  ? ?Transfers ?Overall transfer level: Needs assistance ?Equipment used: Rolling walker (2 wheels) ?Transfers: Sit to/from Stand, Bed to chair/wheelchair/BSC ?Sit to Stand: Min assist ?  ?Step pivot transfers: Min assist ?  ?  ?  ?General transfer comment: Increased time with cues for LE management and use  of UEs to self assist.  Physical assist to bring wt up and fwd and to balance in standing.  Step pvt bed to [facility] to recliner ?  ? ?Ambulation/Gait ?  ?  ?  ?  ?  ?  ?  ?General Gait Details: transfers bed to [facility] to recliner only - pain limited ? ? ?Stairs ?  ?  ?  ?  ?  ? ? ?Wheelchair Mobility ?  ? ?Modified Rankin (Stroke Patients Only) ?  ? ? ?  ?Balance Overall balance assessment: Needs assistance ?Sitting-balance support: No upper extremity supported, Feet supported ?Sitting balance-Leahy Scale: Good ?  ?  ?Standing balance support: Bilateral upper extremity supported ?Standing balance-Leahy Scale: Poor ?  ?  ?  ?  ?  ?  ?  ?  ?  ?  ?  ?  ?  ? ?  ?Cognition Arousal/Alertness: Awake/alert ?Behavior During Therapy: [facility] for tasks assessed/performed ?Overall Cognitive Status: Within Functional Limits for tasks assessed ?  ?  ?  ?  ?  ?  ?  ?  ?  ?  ?  ?  ?  ?  ?  ?  ?  ?  ?  ? ?  ?Exercises   ? ?  ?General Comments   ?  ?  ? ?Pertinent Vitals/Pain Pain Assessment ?Pain Assessment: 0-10 ?Pain Score: 8  ?Pain Location: R hip ?Pain Descriptors / Indicators: Aching, Sore, Grimacing, Guarding ?Pain Intervention(s): Limited activity within patient's tolerance, Monitored during session, Patient requesting pain meds-RN notified, RN gave pain meds during session  ? ? ?Home Living   ?  ?  ?  ?  ?  ?  ?  ?  ?  ?   ?  ?  Prior Function    ?  ?  ?   ? ?PT Goals (current goals can now be found in the care plan section) Acute Rehab PT Goals ?Patient Stated Goal: Regain IND ?PT Goal Formulation: With patient ?Time For Goal Achievement: 12/28/21 ?Potential to Achieve Goals: Good ?Progress towards PT goals: Not progressing toward goals - comment (pain limited) ? ?  ?Frequency ? ? ? 7X/week ? ? ? ?  ?PT Plan Current plan remains appropriate  ? ? ?Co-evaluation   ?  ?  ?  ?  ? ?  ?AM-PAC PT "6 Clicks" Mobility   ?Outcome Measure ? Help needed turning from your back to your side while in a flat bed without using bedrails?: A Little ?Help  needed moving from lying on your back to sitting on the side of a flat bed without using bedrails?: A Little ?Help needed moving to and from a bed to a chair (including a wheelchair)?: A Little ?Help needed standing up from a chair using your arms (e.g., wheelchair or bedside chair)?: A Little ?Help needed to walk in hospital room?: Total ?Help needed climbing 3-5 steps with a railing? : Total ?6 Click Score: 14 ? ?  ?End of Session Equipment Utilized During Treatment: Gait belt ?Activity Tolerance: Patient limited by pain ?Patient left: in chair;with call bell/phone within reach;with chair alarm set;with family/visitor present ?Nurse Communication: Mobility status ?PT Visit Diagnosis: Difficulty in walking, not elsewhere classified (R26.2);Pain ?Pain - Right/Left: Right ?Pain - part of body: Hip ?  ? ? ?Time: [id number] ?PT Time Calculation (min) (ACUTE ONLY): 19 min ? ?Charges:  $Therapeutic Activity: 8-22 mins          ?          ? ?Debe Coder PT ?Acute Rehabilitation Services ?Pager [phone] ?Office [phone] ? ? ? ?Brenda Mccall ?12/23/2021, 12:27 PM ? ?

## 2021-12-23 NOTE — Progress Notes (Signed)
Physical Therapy Treatment ?Patient Details ?Name: Brenda Mccall ?MRN: [id number] ?DOB: [birth year] ?Today's Date: 12/23/2021 ? ? 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 ?  ?PT Comments  ? ? Pt continues very cooperative and with noted improvement in pain control.  Pt requiring increased time but with marked improvement in activity tolerance and up to ambulate 70' in hall.  ?Recommendations for follow up therapy are one component of a multi-disciplinary discharge planning process, led by the attending physician.  Recommendations may be updated based on patient status, additional functional criteria and insurance authorization. ? ?Follow Up Recommendations ? Follow physician's recommendations for discharge plan and follow up therapies ?  ?  ?Assistance Recommended at Discharge Frequent or constant Supervision/Assistance  ?Patient can return home with the following A lot of help with walking and/or transfers;A little help with bathing/dressing/bathroom;Assistance with cooking/housework;Assist for transportation;Help with stairs or ramp for entrance ?  ?Equipment Recommendations ? Rolling walker (2 wheels)  ?  ?Recommendations for Other Services   ? ? ?  ?Precautions / Restrictions Precautions ?Precautions: Fall ?Restrictions ?Weight Bearing Restrictions: No ?Other Position/Activity Restrictions: WBAT  ?  ? ?Mobility ? Bed Mobility ?Overal bed mobility: Needs Assistance ?Bed Mobility: Supine to Sit ?  ?  ?Supine to sit: Min assist, Mod assist ?  ?  ?General bed mobility comments: up in chair and requests back to same ?  ? ?Transfers ?Overall transfer level: Needs assistance ?Equipment used: Rolling walker (2 wheels) ?Transfers: Sit to/from Stand ?Sit to Stand: Min assist ?  ?Step pivot transfers: Min assist ?  ?  ?  ?General transfer comment: Increased time with cues for LE management and use of UEs to self assist.  Physical assist to bring wt up and fwd and to balance in standing ?   ? ?Ambulation/Gait ?Ambulation/Gait assistance: Min assist, Min guard ?Gait Distance (Feet): 70 Feet ?Assistive device: Rolling walker (2 wheels) ?Gait Pattern/deviations: Step-to pattern, Decreased step length - right, Decreased step length - left, Shuffle ?Gait velocity: decr ?  ?  ?General Gait Details: Increased time with cues for posture, position from RW and initial sequence ? ? ?Stairs ?  ?  ?  ?  ?  ? ? ?Wheelchair Mobility ?  ? ?Modified Rankin (Stroke Patients Only) ?  ? ? ?  ?Balance Overall balance assessment: Needs assistance ?Sitting-balance support: No upper extremity supported, Feet supported ?Sitting balance-Leahy Scale: Good ?  ?  ?Standing balance support: Single extremity supported ?Standing balance-Leahy Scale: Poor ?  ?  ?  ?  ?  ?  ?  ?  ?  ?  ?  ?  ?  ? ?  ?Cognition Arousal/Alertness: Awake/alert ?Behavior During Therapy: [facility] for tasks assessed/performed ?Overall Cognitive Status: Within Functional Limits for tasks assessed ?  ?  ?  ?  ?  ?  ?  ?  ?  ?  ?  ?  ?  ?  ?  ?  ?  ?  ?  ? ?  ?Exercises   ? ?  ?General Comments   ?  ?  ? ?Pertinent Vitals/Pain Pain Assessment ?Pain Assessment: 0-10 ?Pain Score: 3  ?Pain Location: R hip ?Pain Descriptors / Indicators: Aching, Sore ?Pain Intervention(s): Limited activity within patient's tolerance, Monitored during session, Premedicated before session  ? ? ?Home Living   ?  ?  ?  ?  ?  ?  ?  ?  ?  ?   ?  ?  Prior Function    ?  ?  ?   ? ?PT Goals (current goals can now be found in the care plan section) Acute Rehab PT Goals ?Patient Stated Goal: Regain IND ?PT Goal Formulation: With patient ?Time For Goal Achievement: 12/28/21 ?Potential to Achieve Goals: Good ?Progress towards PT goals: Progressing toward goals ? ?  ?Frequency ? ? ? 7X/week ? ? ? ?  ?PT Plan Current plan remains appropriate  ? ? ?Co-evaluation   ?  ?  ?  ?  ? ?  ?AM-PAC PT "6 Clicks" Mobility   ?Outcome Measure ? Help needed turning from your back to your side while in a flat bed  without using bedrails?: A Little ?Help needed moving from lying on your back to sitting on the side of a flat bed without using bedrails?: A Little ?Help needed moving to and from a bed to a chair (including a wheelchair)?: A Little ?Help needed standing up from a chair using your arms (e.g., wheelchair or bedside chair)?: A Little ?Help needed to walk in hospital room?: A Little ?Help needed climbing 3-5 steps with a railing? : A Lot ?6 Click Score: 17 ? ?  ?End of Session Equipment Utilized During Treatment: Gait belt ?Activity Tolerance: Patient tolerated treatment well ?Patient left: in chair;with call bell/phone within reach;with chair alarm set ?Nurse Communication: Mobility status ?PT Visit Diagnosis: Difficulty in walking, not elsewhere classified (R26.2);Pain ?Pain - Right/Left: Right ?Pain - part of body: Hip ?  ? ? ?Time: 7619-5093 ?PT Time Calculation (min) (ACUTE ONLY): 31 min ? ?Charges:  $Gait Training: 23-37 mins ?$Therapeutic Activity: 8-22 mins          ?          ? ?Mauro Kaufmann PT ?Acute Rehabilitation Services ?Pager [phone] ?Office [phone] ? ? ? ?Brenda Mccall ?12/23/2021, 12:32 PM ? ?

## 2021-12-23 NOTE — Progress Notes (Signed)
?  Subjective: ?Patient stable.  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  Having some difficulty mobilizing.  ? ?Objective: ?Vital signs in last 24 hours: ?Temp:  [99.2 ?F (37.3 ?C)-99.8 ?F (37.7 ?C)] 99.8 ?F (37.7 ?C) (04/09 0510) ?Pulse Rate:  [84-106] 106 (04/09 0510) ?Resp:  [16-18] 18 (04/09 0510) ?BP: (101-117)/(56-63) 113/63 (04/09 0510) ?SpO2:  [91 %-97 %] 93 % (04/09 0510) ? ?Intake/Output from previous day: ?04/08 0701 - 04/09 0700 ?In: 2348.5 [P.O.:840; I.V.:1508.5] ?Out: 1050 [Urine:1050] ?Intake/Output this shift: ?No intake/output data recorded. ? ?Exam: ? ?Sensation intact distally ?Dorsiflexion/Plantar flexion intact ? ?Labs: ?Recent Labs  ?  12/22/21 ?0324  ?HGB 9.6*  ? ?Recent Labs  ?  12/22/21 ?0324  ?WBC 10.7*  ?RBC 3.02*  ?HCT 30.1*  ?PLT 248  ? ?Recent Labs  ?  12/22/21 ?0324  ?NA 140  ?K 4.6  ?CL 105  ?CO2 29  ?BUN 11  ?CREATININE 0.64  ?GLUCOSE 145*  ?CALCIUM 8.6*  ? ?No results for input(s): LABPT, INR in the last 72 hours. ? ?Assessment/Plan: ?Plan at this time is physical therapy this morning.  Does not really look like she will be ready to go today.  Anticipate higher likelihood of discharge tomorrow.  She is still only able to get for 5 steps and around the room due to her pain. ? ? ?Anderson Malta ?12/23/2021, 8:23 AM  ? ? ?  ?

## 2021-12-23 NOTE — Progress Notes (Signed)
Physical Therapy Treatment ?Patient Details ?Name: Brenda Mccall ?MRN: [id number] ?DOB: [birth year] ?Today's Date: 12/23/2021 ? ? 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 ?  ?PT Comments  ? ? Pt continues very cooperative and with continued improved pain control and activity tolerance.  Pt hopeful for dc home tomorrow am.  ?Recommendations for follow up therapy are one component of a multi-disciplinary discharge planning process, led by the attending physician.  Recommendations may be updated based on patient status, additional functional criteria and insurance authorization. ? ?Follow Up Recommendations ? Follow physician's recommendations for discharge plan and follow up therapies ?  ?  ?Assistance Recommended at Discharge Frequent or constant Supervision/Assistance  ?Patient can return home with the following A little help with bathing/dressing/bathroom;Assistance with cooking/housework;Assist for transportation;Help with stairs or ramp for entrance;A little help with walking and/or transfers ?  ?Equipment Recommendations ? Rolling walker (2 wheels)  ?  ?Recommendations for Other Services   ? ? ?  ?Precautions / Restrictions Precautions ?Precautions: Fall ?Restrictions ?Weight Bearing Restrictions: No ?Other Position/Activity Restrictions: WBAT  ?  ? ?Mobility ? Bed Mobility ?Overal bed mobility: Needs Assistance ?Bed Mobility: Supine to Sit ?  ?  ?Supine to sit: Min assist, Mod assist ?  ?  ?General bed mobility comments: up in chair and requests back to same ?  ? ?Transfers ?Overall transfer level: Needs assistance ?Equipment used: Rolling walker (2 wheels) ?Transfers: Sit to/from Stand ?Sit to Stand: Min assist ?  ?Step pivot transfers: Min assist ?  ?  ?  ?General transfer comment: Increased time with cues for LE management and use of UEs to self assist.  Physical assist to bring wt up and fwd and to balance in standing ?  ? ?Ambulation/Gait ?Ambulation/Gait assistance: Min  guard ?Gait Distance (Feet): 138 Feet ?Assistive device: Rolling walker (2 wheels) ?Gait Pattern/deviations: Step-to pattern, Decreased step length - right, Decreased step length - left, Shuffle ?Gait velocity: decr ?  ?  ?General Gait Details: Increased time with min cues for posture, position from RW ? ? ?Stairs ?  ?  ?  ?  ?  ? ? ?Wheelchair Mobility ?  ? ?Modified Rankin (Stroke Patients Only) ?  ? ? ?  ?Balance Overall balance assessment: Needs assistance ?Sitting-balance support: No upper extremity supported, Feet supported ?Sitting balance-Leahy Scale: Good ?  ?  ?Standing balance support: No upper extremity supported ?Standing balance-Leahy Scale: Fair ?  ?  ?  ?  ?  ?  ?  ?  ?  ?  ?  ?  ?  ? ?  ?Cognition Arousal/Alertness: Awake/alert ?Behavior During Therapy: [facility] for tasks assessed/performed ?Overall Cognitive Status: Within Functional Limits for tasks assessed ?  ?  ?  ?  ?  ?  ?  ?  ?  ?  ?  ?  ?  ?  ?  ?  ?  ?  ?  ? ?  ?Exercises   ? ?  ?General Comments   ?  ?  ? ?Pertinent Vitals/Pain Pain Assessment ?Pain Assessment: 0-10 ?Pain Score: 4  ?Pain Location: R hip ?Pain Descriptors / Indicators: Aching, Sore ?Pain Intervention(s): Limited activity within patient's tolerance, Monitored during session, Premedicated before session  ? ? ?Home Living   ?  ?  ?  ?  ?  ?  ?  ?  ?  ?   ?  ?Prior Function    ?  ?  ?   ? ?  PT Goals (current goals can now be found in the care plan section) Acute Rehab PT Goals ?Patient Stated Goal: Regain IND ?PT Goal Formulation: With patient ?Time For Goal Achievement: 12/28/21 ?Potential to Achieve Goals: Good ?Progress towards PT goals: Progressing toward goals ? ?  ?Frequency ? ? ? 7X/week ? ? ? ?  ?PT Plan Current plan remains appropriate  ? ? ?Co-evaluation   ?  ?  ?  ?  ? ?  ?AM-PAC PT "6 Clicks" Mobility   ?Outcome Measure ? Help needed turning from your back to your side while in a flat bed without using bedrails?: A Little ?Help needed moving from lying on your back to  sitting on the side of a flat bed without using bedrails?: A Little ?Help needed moving to and from a bed to a chair (including a wheelchair)?: A Little ?Help needed standing up from a chair using your arms (e.g., wheelchair or bedside chair)?: A Little ?Help needed to walk in hospital room?: A Little ?Help needed climbing 3-5 steps with a railing? : A Lot ?6 Click Score: 17 ? ?  ?End of Session Equipment Utilized During Treatment: Gait belt ?Activity Tolerance: Patient tolerated treatment well ?Patient left: in chair;with call bell/phone within reach;with chair alarm set;with family/visitor present ?Nurse Communication: Mobility status ?PT Visit Diagnosis: Difficulty in walking, not elsewhere classified (R26.2);Pain ?Pain - Right/Left: Right ?Pain - part of body: Hip ?  ? ? ?Time: 5465-0354 ?PT Time Calculation (min) (ACUTE ONLY): 27 min ? ?Charges:  $Gait Training: 23-37 mins ?$Therapeutic Activity: 8-22 mins          ?          ? ?Mauro Kaufmann PT ?Acute Rehabilitation Services ?Pager [phone] ?Office [phone] ? ? ? ?Saanya Zieske ?12/23/2021, 3:50 PM ? ?

## 2021-12-23 NOTE — Plan of Care (Signed)
?  Problem: Safety: ?Goal: Ability to remain free from injury will improve ?Outcome: Progressing ?  ?Problem: Activity: ?Goal: Ability to avoid complications of mobility impairment will improve ?Outcome: Progressing ?  ?Problem: Pain Management: ?Goal: Pain level will decrease with appropriate interventions ?Outcome: Progressing ?  ?Problem: Skin Integrity: ?Goal: Will show signs of wound healing ?Outcome: Progressing ?  ?

## 2021-12-24 NOTE — Progress Notes (Signed)
Physical Therapy Treatment ?Patient Details ?Name: Brenda Mccall ?MRN: [id number] ?DOB: 07/12/1951 ?Today's Date: 12/24/2021 ? ? 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 ?  ?PT Comments  ? ? POD # 3 ?General Comments: AxO x 3 pleasant, had prior L THR 2019.  Pt eager to go home today.  Feeling better. ?Pt was already OOB in recliner.  General transfer comment: with Spouse "hands on" assisted from recliner using gait belt for safety.  Performed well. General Gait Details: with Spouse "handson" assist amb to and from stairs. General stair comments: with Spouse "hands on" assisted and 25% VC's on proper sequencing and safety.  Performed well.  One trial. Then returned to room to perform some TE's following HEP handout.  Instructed on proper tech, freq as well as use of ICE.   ?Addressed all mobility questions, discussed appropriate activity, educated on use of ICE.  Pt ready for D/C to home. ?  ?Recommendations for follow up therapy are one component of a multi-disciplinary discharge planning process, led by the attending physician.  Recommendations may be updated based on patient status, additional functional criteria and insurance authorization. ? ?Follow Up Recommendations ? Follow physician's recommendations for discharge plan and follow up therapies ?  ?  ?Assistance Recommended at Discharge Frequent or constant Supervision/Assistance  ?Patient can return home with the following A little help with bathing/dressing/bathroom;Assistance with cooking/housework;Assist for transportation;Help with stairs or ramp for entrance;A little help with walking and/or transfers ?  ?Equipment Recommendations ? Rolling walker (2 wheels) (youth)  ?  ?Recommendations for Other Services   ? ? ?  ?Precautions / Restrictions Precautions ?Precautions: Fall ?Restrictions ?Weight Bearing Restrictions: No ?Other Position/Activity Restrictions: WBAT  ?  ? ?Mobility ? Bed Mobility ?  ?  ?  ?  ?  ?  ?   ?General bed mobility comments: OOB in recliner ?  ? ?Transfers ?Overall transfer level: Needs assistance ?Equipment used: Rolling walker (2 wheels) ?Transfers: Sit to/from Stand ?Sit to Stand: Supervision ?  ?  ?  ?  ?  ?General transfer comment: with Spouse "hands on" assisted from recliner using gait belt for safety.  Performed well. ?  ? ?Ambulation/Gait ?Ambulation/Gait assistance: Supervision ?Gait Distance (Feet): 38 Feet ?Assistive device: Rolling walker (2 wheels) ?Gait Pattern/deviations: Step-to pattern, Decreased step length - right, Decreased step length - left, Shuffle ?Gait velocity: decreased ?  ?  ?General Gait Details: with Spouse "handson" assist amb to and from stairs. ? ? ?Stairs ?Stairs: Yes ?Stairs assistance: Supervision, Min guard ?Stair Management: Two rails, Step to pattern, Forwards ?Number of Stairs: 2 ?General stair comments: with Spouse "hands on" assisted and 25% VC's on proper sequencing and safety.  Performed well.  One trial. ? ? ?Wheelchair Mobility ?  ? ?Modified Rankin (Stroke Patients Only) ?  ? ? ?  ?Balance   ?  ?  ?  ?  ?  ?  ?  ?  ?  ?  ?  ?  ?  ?  ?  ?  ?  ?  ?  ? ?  ?Cognition Arousal/Alertness: Awake/alert ?Behavior During Therapy: [facility] for tasks assessed/performed ?Overall Cognitive Status: Within Functional Limits for tasks assessed ?  ?  ?  ?  ?  ?  ?  ?  ?  ?  ?  ?  ?  ?  ?  ?  ?General Comments: AxO x 3 pleasant, had prior L  THR 2019 ?  ?  ? ?  ?Exercises   ?Total Hip Replacement TE's following HEP Handout ?10 reps ankle pumps ?05 reps knee presses ?05 reps heel slides ?05 reps SAQ's ?05 reps ABD ?05 reps LAQ's ?05 reps all standing TE's  ?Instructed how to use a belt loop to assist  ?Followed by ICE ?Spouse also present  ?  ?General Comments   ?  ?  ? ?Pertinent Vitals/Pain Pain Assessment ?Pain Assessment: 0-10 ?Pain Score: 4  ?Pain Location: R hip ?Pain Descriptors / Indicators: Aching, Sore, Operative site guarding ?Pain Intervention(s): Monitored during  session, Premedicated before session, Repositioned  ? ? ?Home Living   ?  ?  ?  ?  ?  ?  ?  ?  ?  ?   ?  ?Prior Function    ?  ?  ?   ? ?PT Goals (current goals can now be found in the care plan section) Progress towards PT goals: Progressing toward goals ? ?  ?Frequency ? ? ? 7X/week ? ? ? ?  ?PT Plan Current plan remains appropriate  ? ? ?Co-evaluation   ?  ?  ?  ?  ? ?  ?AM-PAC PT "6 Clicks" Mobility   ?Outcome Measure ? Help needed turning from your back to your side while in a flat bed without using bedrails?: A Little ?Help needed moving from lying on your back to sitting on the side of a flat bed without using bedrails?: A Little ?Help needed moving to and from a bed to a chair (including a wheelchair)?: A Little ?Help needed standing up from a chair using your arms (e.g., wheelchair or bedside chair)?: A Little ?Help needed to walk in hospital room?: A Little ?Help needed climbing 3-5 steps with a railing? : A Little ?6 Click Score: 18 ? ?  ?End of Session Equipment Utilized During Treatment: Gait belt ?Activity Tolerance: Patient tolerated treatment well ?Patient left: in chair;with call bell/phone within reach;with chair alarm set;with family/visitor present ?Nurse Communication: Mobility status (pt ready for D/C to home) ?PT Visit Diagnosis: Difficulty in walking, not elsewhere classified (R26.2);Pain ?Pain - Right/Left: Right ?  ? ? ?Time: 1100-1125 ?PT Time Calculation (min) (ACUTE ONLY): 25 min ? ?Charges:  $Gait Training: 8-22 mins ?$Therapeutic Exercise: 8-22 mins          ?          ? ?Felecia Shelling  PTA ?Acute  Rehabilitation Services ?Pager      [phone] ?Office      [phone] ? ? ?

## 2021-12-24 NOTE — Progress Notes (Signed)
Patient ID: Brenda Mccall, female   DOB: Jan 31, 1951, 71 y.o.   MRN: [id number] ?Awake and alert this am.  Vitals stable.  She reports that she feels better overall.  Right hip stable.  Can be discharged to home today. ?

## 2021-12-24 NOTE — Discharge Summary (Signed)
?Patient ID: ?Brenda Mccall ?MRN: [id number] ?DOB/AGE: 17-Sep-1950 71 y.o. ? ?Admit date: 12/21/2021 ?Discharge date: 12/24/2021 ? ?Admission Diagnoses:  ?Principal Problem: ?  Unilateral primary osteoarthritis, right hip ?Active Problems: ?  Status post total replacement of right hip ? ? ?Discharge Diagnoses:  ?Same ? ?Past Medical History:  ?Diagnosis Date  ? Arthritis   ? History of kidney stones   ? Hypertension   ? ? ?Surgeries: Procedure(s): ?RIGHT TOTAL HIP ARTHROPLASTY ANTERIOR APPROACH on 12/21/2021 ?  ?Consultants:  ? ?Discharged Condition: Improved ? ?Hospital Course: Brenda Mccall is an 71 y.o. female who was admitted 12/21/2021 for operative treatment ofUnilateral primary osteoarthritis, right hip. Patient has severe unremitting pain that affects sleep, daily activities, and work/hobbies. After pre-op clearance the patient was taken to the operating room on 12/21/2021 and underwent  Procedure(s): ?RIGHT TOTAL HIP ARTHROPLASTY ANTERIOR APPROACH.   ? ?Patient was given perioperative antibiotics:  ?Anti-infectives (From admission, onward)  ? ? Start     Dose/Rate Route Frequency Ordered Stop  ? 12/21/21 1600  ceFAZolin (ANCEF) IVPB 1 g/50 mL premix       ? 1 g ?100 mL/hr over 30 Minutes Intravenous Every 6 hours 12/21/21 1421 12/21/21 2137  ? 12/21/21 0830  ceFAZolin (ANCEF) IVPB 2g/100 mL premix       ? 2 g ?200 mL/hr over 30 Minutes Intravenous On call to O.R. 12/21/21 [id number] 12/21/21 1054  ? ?  ?  ? ?Patient was given sequential compression devices, early ambulation, and chemoprophylaxis to prevent DVT. ? ?Patient benefited maximally from hospital stay and there were no complications.   ? ?Recent vital signs: Patient Vitals for the past 24 hrs: ? BP Temp Temp src Pulse Resp SpO2  ?12/24/21 0552 114/63 98.6 ?F (37 ?C) Oral 90 16 97 %  ?12/23/21 2148 (!) 110/57 99 ?F (37.2 ?C) Oral (!) 103 16 97 %  ?12/23/21 1402 98/63 (!) 100.4 ?F (38 ?C) Oral 94 16 96 %  ?  ? ?Recent laboratory studies:  ?Recent Labs  ?   12/22/21 ?0324  ?WBC 10.7*  ?HGB 9.6*  ?HCT 30.1*  ?PLT 248  ?NA 140  ?K 4.6  ?CL 105  ?CO2 29  ?BUN 11  ?CREATININE 0.64  ?GLUCOSE 145*  ?CALCIUM 8.6*  ? ? ? ?Discharge Medications:   ?Allergies as of 12/24/2021   ? ?   Reactions  ? Doxycycline Hyclate Hives, Shortness Of Breath  ? Amlodipine Swelling, Other (See Comments)  ? Leg swelling and jerking  ? Prednisone   ? Headache/ Increase blood pressure  ? Flagyl [metronidazole] Rash  ? ?  ? ?  ?Medication List  ?  ? ?TAKE these medications   ? ?aspirin 81 MG chewable tablet ?Chew 1 tablet (81 mg total) by mouth 2 (two) times daily. ?  ?meloxicam 15 MG tablet ?Commonly known as: MOBIC ?TAKE 1 TABLET BY MOUTH EVERY DAY AS NEEDED FOR PAIN ?  ?methocarbamol 500 MG tablet ?Commonly known as: ROBAXIN ?Take 1 tablet (500 mg total) by mouth every 6 (six) hours as needed for muscle spasms. ?  ?oxyCODONE 5 MG immediate release tablet ?Commonly known as: Oxy IR/ROXICODONE ?Take 1-2 tablets (5-10 mg total) by mouth every 4 (four) hours as needed for moderate pain (pain score 4-6). ?  ?PARoxetine 10 MG tablet ?Commonly known as: PAXIL ?Take 10 mg by mouth every morning. ?  ?rosuvastatin 10 MG tablet ?Commonly known as: CRESTOR ?Take 10 mg by mouth daily. ?  ?telmisartan 20 MG  tablet ?Commonly known as: MICARDIS ?Take 20 mg by mouth daily. ?  ? ?  ? ?  ?  ? ? ?  ?Durable Medical Equipment  ?(From admission, onward)  ?  ? ? ?  ? ?  Start     Ordered  ? 12/22/21 0914  For home use only DME Walker youth  Once       ?Question:  Patient needs a walker to treat with the following condition  Answer:  Home help needed  ? 12/22/21 0914  ? 12/21/21 1422  DME 3 n 1  Once       ? 12/21/21 1421  ? 12/21/21 1422  DME Walker rolling  Once       ?Question Answer Comment  ?Walker: With 5 Inch Wheels   ?Patient needs a walker to treat with the following condition Status post total replacement of right hip   ?  ? 12/21/21 1421  ? ?  ?  ? ?  ? ? ?Diagnostic Studies: DG Pelvis Portable ? ?Result Date:  12/21/2021 ?CLINICAL DATA:  Status post right hip arthroplasty EXAM: PORTABLE PELVIS 1-2 VIEWS COMPARISON:  08/07/2018 FINDINGS: There is interval right hip arthroplasty. There are pockets of air in the soft tissues. Skin staples are seen. There is previous left hip arthroplasty. IMPRESSION: Status post right hip arthroplasty. Electronically Signed   By: Elmer Picker M.D.   On: 12/21/2021 13:08  ? ?DG C-Arm 1-60 Min-No Report ? ?Result Date: 12/21/2021 ?Fluoroscopy was utilized by the requesting physician.  No radiographic interpretation.  ? ?DG C-Arm 1-60 Min-No Report ? ?Result Date: 12/21/2021 ?Fluoroscopy was utilized by the requesting physician.  No radiographic interpretation.  ? ?DG HIP UNILAT WITH PELVIS 1V RIGHT ? ?Result Date: 12/21/2021 ?CLINICAL DATA:  A 71 year old female presents for evaluation of hip arthroplasty, intraoperative fluoroscopic assessment. EXAM: DG HIP (WITH OR WITHOUT PELVIS) 1V RIGHT; DG C-ARM 1-60 MIN-NO REPORT COMPARISON:  December 29, 2019 pelvis evaluation. FINDINGS: Low AP pelvis and AP and lateral views of the RIGHT hip are submitted. Intraoperative fluoroscopic views are provided total of 3 images. Post RIGHT hip arthroplasty with gas in the soft tissues. Cerclage wire about the proximal femur presumably related to periprosthetic fracture or suspected weakness in this area which is not visible on the submitted views. Imaging is otherwise unremarkable without signs of unexpected postprocedural findings. Low AP pelvis excluding the iliac crest shows portion of the contralateral hip arthroplasty. IMPRESSION: Post RIGHT hip arthroplasty as described. Cerclage wire about the proximal femur presumably related to periprosthetic fracture or weakness which is not visible on the submitted views. No unexpected radiographic findings. Electronically Signed   By: Zetta Bills M.D.   On: 12/21/2021 12:14   ? ?Disposition: Discharge disposition: 01-Home or Self Care ? ? ? ? ? ? ?Discharge  Instructions   ? ? Discharge patient   Complete by: As directed ?  ? Discharge disposition: 01-Home or Self Care  ? Discharge patient date: 12/24/2021  ? ?  ? ? ? Follow-up Information   ? ? Mcarthur Rossetti, MD Follow up in 2 week(s).   ?Specialty: Orthopedic Surgery ?Contact information: ?[address] ?[place] [address] ?[phone] ? ? ?  ?  ? ? [facility] [place] Follow up.   ?Specialty: Home Health Services ?Why: to provide home physical therapy ?Contact information: ?[address] ?[place] [address] ?[phone] ? ? ?  ?  ? ?  ?  ? ?  ? ? ? ?  Signed: ?Mcarthur Rossetti ?12/24/2021, 7:36 AM ? ? ? ?

## 2021-12-24 NOTE — Plan of Care (Signed)
?  Problem: Safety: ?Goal: Ability to remain free from injury will improve ?Outcome: Progressing ?  ?Problem: Activity: ?Goal: Ability to avoid complications of mobility impairment will improve ?Outcome: Progressing ?Goal: Ability to tolerate increased activity will improve ?Outcome: Progressing ?  ?Problem: Pain Management: ?Goal: Pain level will decrease with appropriate interventions ?Outcome: Progressing ?  ?

## 2021-12-24 NOTE — Progress Notes (Signed)
Discharge package printed and instructions given to patient and husband. Verbalize understanding. ?

## 2021-12-25 ENCOUNTER — Encounter (HOSPITAL_COMMUNITY): Payer: Self-pay | Admitting: Orthopaedic Surgery

## 2022-01-03 ENCOUNTER — Ambulatory Visit (INDEPENDENT_AMBULATORY_CARE_PROVIDER_SITE_OTHER): Payer: Medicare Other | Admitting: Orthopaedic Surgery

## 2022-01-03 ENCOUNTER — Encounter: Payer: Self-pay | Admitting: Orthopaedic Surgery

## 2022-01-03 DIAGNOSIS — Z96641 Presence of right artificial hip joint: Secondary | ICD-10-CM

## 2022-01-03 NOTE — Progress Notes (Signed)
The patient is 2 weeks status post a right total hip arthroplasty.  We replaced her left hip in 2019.  She said this has been a little bit more difficult in terms of the recovery from her right hip than her left hip.  She does report bilateral foot and ankle swelling.  She was wearing compressive hose but she stated that the physical therapist told her to not wear those because that was making the swelling worse.  I told her I actually disagree adamantly with that statement and she needs to be wearing the compressive hose because this will help with the return of circulation and decrease her swelling.  Mobility will help as well as elevation.  She denies any calf pain.  She has been on aspirin twice a day and I want her to continue that for 1 more week. ? ?Her right hip incision looks good.  I did remove the staples and place Steri-Strips.  There is no seroma. ? ?I would like to see her back in 2 weeks for repeat exam and assessment of her leg swelling.  No x-rays are needed.  If there are any issues before then she needs to let us know. ?

## 2022-01-17 ENCOUNTER — Ambulatory Visit (INDEPENDENT_AMBULATORY_CARE_PROVIDER_SITE_OTHER): Payer: Medicare Other | Admitting: Orthopaedic Surgery

## 2022-01-17 ENCOUNTER — Encounter: Payer: Self-pay | Admitting: Orthopaedic Surgery

## 2022-01-17 DIAGNOSIS — Z96641 Presence of right artificial hip joint: Secondary | ICD-10-CM

## 2022-01-17 NOTE — Progress Notes (Signed)
The patient is now 4 weeks status post a right hip replacement.  She has a remote history of the left hip that we replaced.  She says she is walking much better overall and the swelling is decreasing quite a bit. ? ?Her right hip incision looks good.  There is just a small amount of dehiscence at the very top in the groin crease but it is not worrisome. ? ?I will have her place Bactroban ointment on that area daily.  This will be after she showers.  We will see her back in 4 weeks to see how she is doing overall but no x-rays are needed.  If she looks good at that visit we will probably not need to see her back for 6 months.  She is very pleased overall thus far. ?

## 2022-02-14 ENCOUNTER — Encounter: Payer: Self-pay | Admitting: Orthopaedic Surgery

## 2022-02-14 ENCOUNTER — Ambulatory Visit (INDEPENDENT_AMBULATORY_CARE_PROVIDER_SITE_OTHER): Payer: Medicare Other | Admitting: Orthopaedic Surgery

## 2022-02-14 DIAGNOSIS — Z96641 Presence of right artificial hip joint: Secondary | ICD-10-CM

## 2022-02-14 NOTE — Progress Notes (Signed)
The patient is around 6 weeks status post a right total hip arthroplasty.  We replaced her left hip in 2019.  She is 8 and very active.  On exam both hips move smoothly and fluidly.  She has just a slight leg length discrepancy with her more recent right operative hip just slightly longer than the left but she says is not really bothering her.  Overall though she does look great.  From my standpoint I do not need to see her back for 6 months unless she is having any issues.  We will have a standing low AP pelvis at that visit.  All questions and concerns were answered and addressed.

## 2022-07-13 IMAGING — DX DG PORTABLE PELVIS
1 series · 1 of 1 positions shown · non-contrast
Comparison: 08/07/2018

CLINICAL DATA: Status post right hip arthroplasty

EXAM:
PORTABLE PELVIS 1-2 VIEWS

[pelvis ap]
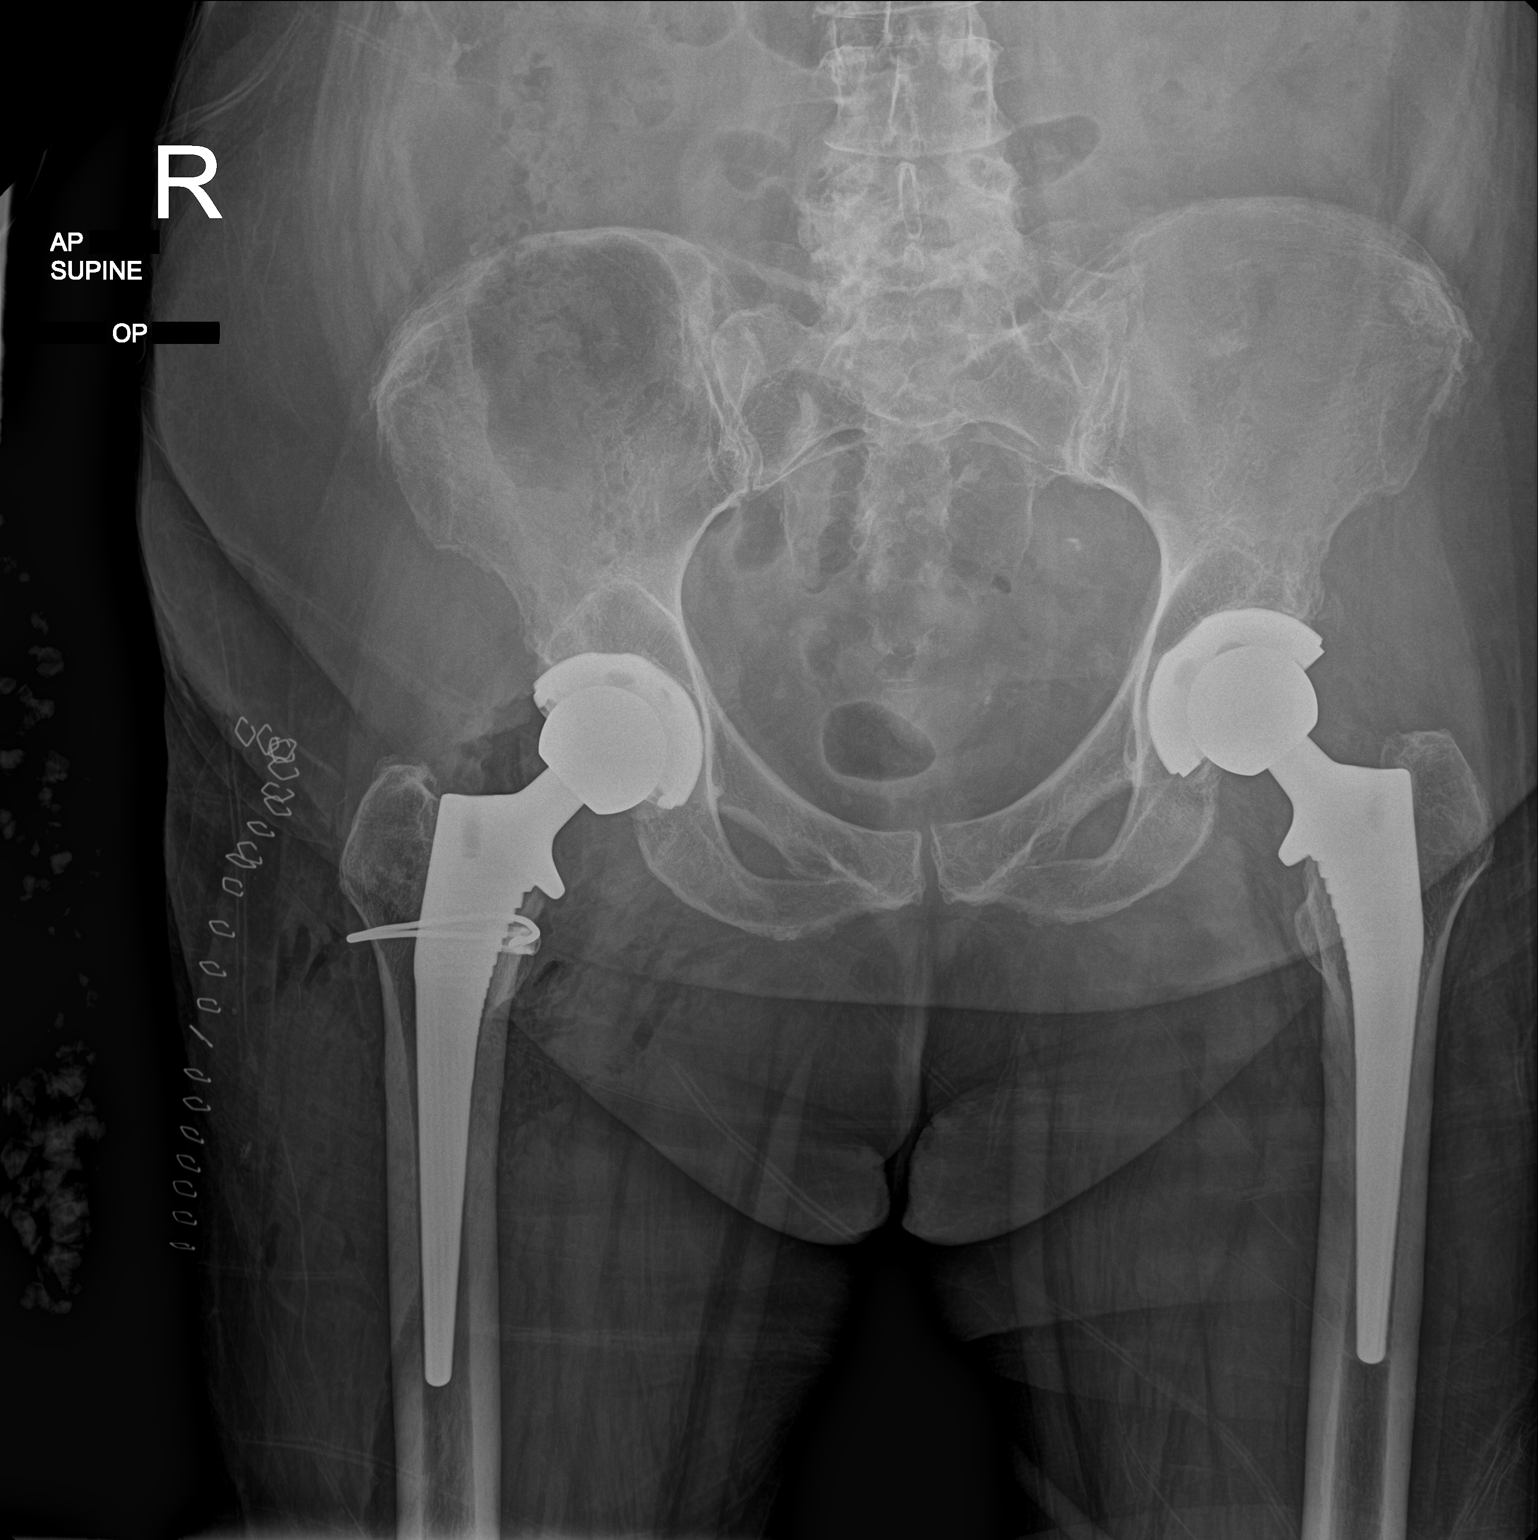

[1 of 1 positions shown; findings below may reference images not displayed]

FINDINGS: There is interval right hip arthroplasty. There are pockets of air
in the soft tissues. Skin staples are seen. There is previous left
hip arthroplasty.
IMPRESSION: Status post right hip arthroplasty.

## 2022-08-19 ENCOUNTER — Encounter: Payer: Self-pay | Admitting: Orthopaedic Surgery

## 2022-08-19 ENCOUNTER — Ambulatory Visit (INDEPENDENT_AMBULATORY_CARE_PROVIDER_SITE_OTHER): Payer: Medicare Other | Admitting: Orthopaedic Surgery

## 2022-08-19 ENCOUNTER — Ambulatory Visit (INDEPENDENT_AMBULATORY_CARE_PROVIDER_SITE_OTHER): Payer: Medicare Other

## 2022-08-19 DIAGNOSIS — Z96641 Presence of right artificial hip joint: Secondary | ICD-10-CM

## 2022-08-19 DIAGNOSIS — M1611 Unilateral primary osteoarthritis, right hip: Secondary | ICD-10-CM | POA: Diagnosis not present

## 2022-08-19 DIAGNOSIS — M1612 Unilateral primary osteoarthritis, left hip: Secondary | ICD-10-CM

## 2022-08-19 NOTE — Progress Notes (Signed)
The patient is a 71 year old female who we have replaced both of her hips.  Her left hip was replaced in November 2019 and her right hip this year in April.  She says she has no issues at all and she is doing very well.  On exam she walks with a normal gait.  Her leg lengths are equal.  Both hips move smoothly and fluidly.  An AP pelvis shows bilateral total hip arthroplasties that are bone ingrown with no complicating features.  At this point follow-up for hips can be as needed.  If she does develop any issues at all she knows to let us know.  All question concerns were answered and addressed.

## 2022-11-21 ENCOUNTER — Encounter: Payer: Self-pay | Admitting: Radiology

## 2023-02-05 ENCOUNTER — Ambulatory Visit: Payer: PRIVATE HEALTH INSURANCE | Admitting: Orthopaedic Surgery

## 2023-02-17 ENCOUNTER — Ambulatory Visit (INDEPENDENT_AMBULATORY_CARE_PROVIDER_SITE_OTHER): Payer: Medicare HMO | Admitting: Orthopaedic Surgery

## 2023-02-17 ENCOUNTER — Other Ambulatory Visit (INDEPENDENT_AMBULATORY_CARE_PROVIDER_SITE_OTHER): Payer: Medicare HMO

## 2023-02-17 DIAGNOSIS — M25562 Pain in left knee: Secondary | ICD-10-CM

## 2023-02-17 DIAGNOSIS — G8929 Other chronic pain: Secondary | ICD-10-CM | POA: Diagnosis not present

## 2023-02-17 MED ORDER — LIDOCAINE HCL 1 % IJ SOLN
3.0000 mL | INTRAMUSCULAR | Status: AC | PRN
  Administered 2023-02-17: 3 mL

## 2023-02-17 MED ORDER — METHYLPREDNISOLONE ACETATE 40 MG/ML IJ SUSP
40.0000 mg | INTRAMUSCULAR | Status: AC | PRN
  Administered 2023-02-17: 40 mg via INTRA_ARTICULAR

## 2023-02-17 NOTE — Progress Notes (Signed)
The patient is a 72 year old female well-known to me.  We have replaced both of her hips.  She has been having on and off left knee pain for the last 6 months and has developed some popping in the knee as well.  It is worse at night.  She feels like it is throwing off her gait and she is experiencing some sciatica on that left side.  Examination of her left knee shows a mild effusion.  She has painful range of motion of the knee and definitely patellofemoral crepitation.  The knee is ligamentously stable.  2 views left knee show significant patellofemoral arthritis with large osteophytes at the patellofemoral joint and joint space narrowing.  The medial lateral compartments are well-maintained.  There is a sclerotic bone island in one of the condyles posteriorly.  I was able to aspirate at least 25 cc of clear fluid from the knee and placed a steroid injection in her left knee.  She tolerated this very well and felt much better afterwards.  I would like to see her back in 2 weeks to see how she is doing overall.  This is the knee that I would consider a MRI if it continues to give her issues.  She is already taking meloxicam as an anti-inflammatory.    Procedure Note  Patient: Brenda Mccall             Date of Birth: 07-28-1951           MRN: [id number]             Visit Date: 02/17/2023  Procedures: Visit Diagnoses:  1. Chronic pain of left knee     Large Joint Inj: L knee on 02/17/2023 2:43 PM Indications: diagnostic evaluation and pain Details: 22 G 1.5 in needle, superolateral approach  Arthrogram: No  Medications: 3 mL lidocaine 1 %; 40 mg methylPREDNISolone acetate 40 MG/ML Outcome: tolerated well, no immediate complications Procedure, treatment alternatives, risks and benefits explained, specific risks discussed. Consent was given by the patient. Immediately prior to procedure a time out was called to verify the correct patient, procedure, equipment, support staff and site/side  marked as required. Patient was prepped and draped in the usual sterile fashion.

## 2023-03-11 ENCOUNTER — Ambulatory Visit: Payer: PRIVATE HEALTH INSURANCE | Admitting: Physician Assistant

## 2023-08-20 ENCOUNTER — Ambulatory Visit: Payer: PRIVATE HEALTH INSURANCE | Admitting: Orthopaedic Surgery

## 2023-10-15 ENCOUNTER — Ambulatory Visit: Payer: PRIVATE HEALTH INSURANCE | Admitting: Orthopaedic Surgery

## 2023-10-29 ENCOUNTER — Ambulatory Visit: Payer: PRIVATE HEALTH INSURANCE | Admitting: Orthopaedic Surgery

## 2023-11-05 ENCOUNTER — Ambulatory Visit: Payer: PRIVATE HEALTH INSURANCE | Admitting: Orthopaedic Surgery

## 2023-11-13 ENCOUNTER — Ambulatory Visit: Payer: Medicare HMO | Admitting: Orthopaedic Surgery

## 2023-11-13 DIAGNOSIS — G8929 Other chronic pain: Secondary | ICD-10-CM | POA: Diagnosis not present

## 2023-11-13 DIAGNOSIS — M25562 Pain in left knee: Secondary | ICD-10-CM | POA: Diagnosis not present

## 2023-11-13 MED ORDER — METHYLPREDNISOLONE ACETATE 40 MG/ML IJ SUSP
40.0000 mg | INTRAMUSCULAR | Status: AC | PRN
  Administered 2023-11-13: 40 mg via INTRA_ARTICULAR

## 2023-11-13 MED ORDER — METHYLPREDNISOLONE 4 MG PO TABS: ORAL_TABLET | ORAL | 0 refills | Status: DC

## 2023-11-13 MED ORDER — LIDOCAINE HCL 1 % IJ SOLN
3.0000 mL | INTRAMUSCULAR | Status: AC | PRN
Start: 2023-11-13 — End: 2023-11-13
  Administered 2023-11-13: 3 mL

## 2023-11-13 NOTE — Progress Notes (Signed)
 The patient is well-known to [place].  She has significant arthritis in her left knee.  It has been 9 months since we injected the knee with a steroid.  She has her hip replaced and has no issues with her hips other than a little bit of catching with her left hip but I think this may be related to her left knee.  She does report pain at night with her knee and other days and does not bother her much at all.  She is interested in a steroid injection today.  She is also been having bilateral hand pain and she does occasionally take meloxicam and Tylenol arthritis.  She would like a steroid taper.  Examination of her left knee she has patellofemoral crepitation.  There is no effusion with good range of motion.  I think this is contributing to some left hip IT band pain.  She still not interested still in any knee replacement surgery.  I agree with placing a steroid injection in her knee today which she did tolerate well.  I will send in a steroid taper as well for her hands.  Follow-up can be as needed.  All questions and concerns were addressed and answered.    Procedure Note  Patient: Brenda Mccall             Date of Birth: 1951-04-18           MRN: [id number]             Visit Date: 11/13/2023  Procedures: Visit Diagnoses: No diagnosis found.  Large Joint Inj: L knee on 11/13/2023 2:11 PM Indications: diagnostic evaluation and pain Details: 22 G 1.5 in needle, superolateral approach  Arthrogram: No  Medications: 3 mL lidocaine 1 %; 40 mg methylPREDNISolone acetate 40 MG/ML Outcome: tolerated well, no immediate complications Procedure, treatment alternatives, risks and benefits explained, specific risks discussed. Consent was given by the patient. Immediately prior to procedure a time out was called to verify the correct patient, procedure, equipment, support staff and site/side marked as required. Patient was prepped and draped in the usual sterile fashion.

## 2024-05-19 ENCOUNTER — Ambulatory Visit: Payer: PRIVATE HEALTH INSURANCE | Admitting: Orthopaedic Surgery

## 2024-05-19 ENCOUNTER — Encounter: Payer: Self-pay | Admitting: Orthopaedic Surgery

## 2024-05-19 DIAGNOSIS — M25562 Pain in left knee: Secondary | ICD-10-CM

## 2024-05-19 DIAGNOSIS — G8929 Other chronic pain: Secondary | ICD-10-CM

## 2024-05-19 MED ORDER — METHYLPREDNISOLONE ACETATE 40 MG/ML IJ SUSP
40.0000 mg | INTRAMUSCULAR | Status: AC | PRN
  Administered 2024-05-19: 40 mg via INTRA_ARTICULAR

## 2024-05-19 MED ORDER — METHYLPREDNISOLONE 4 MG PO TABS: ORAL_TABLET | ORAL | 0 refills | Status: AC

## 2024-05-19 MED ORDER — LIDOCAINE HCL 1 % IJ SOLN
3.0000 mL | INTRAMUSCULAR | Status: AC | PRN
  Administered 2024-05-19: 3 mL

## 2024-05-19 NOTE — Progress Notes (Signed)
 The patient is well-known to us .  We have replaced the hip in the past.  She comes in from time to time for steroid injection of the left knee.  She is now 73 years old and still very active.  She says her left knee is starting to hurt again and has been just over 6 months and she has had a steroid injection in that left knee and would like to have one again today.  She says it does wake her up at night.  She is also requesting a steroid taper which has helped quite a bit in the past.  She is not a diabetic.  She has had no acute change in her medical status.  She denies any locking catching with the left knee or mechanical symptoms of instability.  Examination today of her left knee shows full range of motion with no effusion.  She has pain throughout the arc of motion of the exam with some patellofemoral crepitation.  The knee is ligamentously stable.  Per request we did place a steroid injection in her left knee which she tolerated well and we will send in a steroid taper.  Follow-up can be as needed.  She states that the injection does last her almost 6 months or more.    Procedure Note  Patient: Brenda Mccall             Date of Birth: 1950-09-29           MRN: [id number]             Visit Date: 05/19/2024  Procedures: Visit Diagnoses:  1. Chronic pain of left knee     Large Joint Inj: L knee on 05/19/2024 1:40 PM Indications: diagnostic evaluation and pain Details: 22 G 1.5 in needle, superolateral approach  Arthrogram: No  Medications: 3 mL lidocaine  1 %; 40 mg methylPREDNISolone  acetate 40 MG/ML Outcome: tolerated well, no immediate complications Procedure, treatment alternatives, risks and benefits explained, specific risks discussed. Consent was given by the patient. Immediately prior to procedure a time out was called to verify the correct patient, procedure, equipment, support staff and site/side marked as required. Patient was prepped and draped in the usual sterile fashion.

## 2024-07-19 ENCOUNTER — Encounter: Payer: Self-pay | Admitting: Radiology
